# Patient Record
Sex: Male | Born: 1955 | Race: Black or African American | Hispanic: No | Marital: Married | Smoking: Never smoker
Health system: Southern US, Community
[De-identification: ages and names within clinical notes are randomized; demographics above are authoritative.]

## PROBLEM LIST (undated history)

## (undated) DIAGNOSIS — K219 Gastro-esophageal reflux disease without esophagitis: Secondary | ICD-10-CM

## (undated) DIAGNOSIS — D126 Benign neoplasm of colon, unspecified: Secondary | ICD-10-CM

## (undated) DIAGNOSIS — IMO0002 Reserved for concepts with insufficient information to code with codable children: Secondary | ICD-10-CM

## (undated) DIAGNOSIS — E785 Hyperlipidemia, unspecified: Secondary | ICD-10-CM

## (undated) DIAGNOSIS — I10 Essential (primary) hypertension: Secondary | ICD-10-CM

## (undated) DIAGNOSIS — C61 Malignant neoplasm of prostate: Secondary | ICD-10-CM

## (undated) HISTORY — DX: Gastro-esophageal reflux disease without esophagitis: K21.9

## (undated) HISTORY — DX: Hyperlipidemia, unspecified: E78.5

## (undated) HISTORY — DX: Essential (primary) hypertension: I10

## (undated) HISTORY — DX: Reserved for concepts with insufficient information to code with codable children: IMO0002

## (undated) HISTORY — PX: PROSTATECTOMY: SHX69

## (undated) HISTORY — DX: Malignant neoplasm of prostate: C61

## (undated) HISTORY — DX: Benign neoplasm of colon, unspecified: D12.6

## (undated) HISTORY — PX: KNEE ARTHROSCOPY W/ MENISCECTOMY: SHX1879

---

## 1999-04-25 ENCOUNTER — Encounter: Payer: Self-pay | Admitting: Family Medicine

## 1999-04-25 ENCOUNTER — Ambulatory Visit (HOSPITAL_COMMUNITY): Admission: RE | Admit: 1999-04-25 | Discharge: 1999-04-25 | Payer: Self-pay | Admitting: Family Medicine

## 2000-04-02 ENCOUNTER — Ambulatory Visit (HOSPITAL_COMMUNITY): Admission: RE | Admit: 2000-04-02 | Discharge: 2000-04-02 | Payer: Self-pay | Admitting: Family Medicine

## 2000-04-02 ENCOUNTER — Encounter: Payer: Self-pay | Admitting: Family Medicine

## 2000-04-21 HISTORY — PX: LAMINECTOMY AND MICRODISCECTOMY LUMBAR SPINE: SHX1913

## 2000-04-28 ENCOUNTER — Encounter: Payer: Self-pay | Admitting: Neurosurgery

## 2000-04-28 ENCOUNTER — Inpatient Hospital Stay (HOSPITAL_COMMUNITY): Admission: RE | Admit: 2000-04-28 | Discharge: 2000-04-29 | Payer: Self-pay | Admitting: Neurosurgery

## 2002-04-21 DIAGNOSIS — C61 Malignant neoplasm of prostate: Secondary | ICD-10-CM

## 2002-04-21 HISTORY — DX: Malignant neoplasm of prostate: C61

## 2003-02-08 ENCOUNTER — Other Ambulatory Visit: Admission: RE | Admit: 2003-02-08 | Discharge: 2003-02-08 | Payer: Self-pay | Admitting: Dermatology

## 2003-03-22 ENCOUNTER — Inpatient Hospital Stay (HOSPITAL_COMMUNITY): Admission: RE | Admit: 2003-03-22 | Discharge: 2003-03-27 | Payer: Self-pay | Admitting: Urology

## 2003-03-22 ENCOUNTER — Encounter (INDEPENDENT_AMBULATORY_CARE_PROVIDER_SITE_OTHER): Payer: Self-pay | Admitting: *Deleted

## 2004-06-12 ENCOUNTER — Ambulatory Visit (HOSPITAL_COMMUNITY): Admission: RE | Admit: 2004-06-12 | Discharge: 2004-06-12 | Payer: Self-pay | Admitting: Family Medicine

## 2004-06-20 ENCOUNTER — Ambulatory Visit: Payer: Self-pay | Admitting: Orthopedic Surgery

## 2004-06-20 ENCOUNTER — Ambulatory Visit (HOSPITAL_COMMUNITY): Admission: RE | Admit: 2004-06-20 | Discharge: 2004-06-20 | Payer: Self-pay | Admitting: Family Medicine

## 2004-07-18 ENCOUNTER — Ambulatory Visit: Payer: Self-pay | Admitting: Orthopedic Surgery

## 2004-07-19 ENCOUNTER — Ambulatory Visit: Payer: Self-pay | Admitting: Pulmonary Disease

## 2004-09-23 ENCOUNTER — Ambulatory Visit: Payer: Self-pay | Admitting: Pulmonary Disease

## 2004-10-21 ENCOUNTER — Ambulatory Visit: Payer: Self-pay | Admitting: Orthopedic Surgery

## 2005-04-21 HISTORY — PX: COLONOSCOPY: SHX174

## 2005-10-22 ENCOUNTER — Emergency Department (HOSPITAL_COMMUNITY): Admission: EM | Admit: 2005-10-22 | Discharge: 2005-10-22 | Payer: Self-pay | Admitting: Emergency Medicine

## 2005-12-19 ENCOUNTER — Ambulatory Visit (HOSPITAL_COMMUNITY): Admission: RE | Admit: 2005-12-19 | Discharge: 2005-12-19 | Payer: Self-pay | Admitting: Gastroenterology

## 2005-12-19 ENCOUNTER — Ambulatory Visit: Payer: Self-pay | Admitting: Gastroenterology

## 2005-12-19 ENCOUNTER — Encounter (INDEPENDENT_AMBULATORY_CARE_PROVIDER_SITE_OTHER): Payer: Self-pay | Admitting: *Deleted

## 2007-05-05 ENCOUNTER — Ambulatory Visit: Payer: Self-pay | Admitting: Orthopedic Surgery

## 2007-05-05 DIAGNOSIS — M719 Bursopathy, unspecified: Secondary | ICD-10-CM

## 2007-05-05 DIAGNOSIS — M25519 Pain in unspecified shoulder: Secondary | ICD-10-CM | POA: Insufficient documentation

## 2007-05-05 DIAGNOSIS — M67919 Unspecified disorder of synovium and tendon, unspecified shoulder: Secondary | ICD-10-CM | POA: Insufficient documentation

## 2007-07-06 ENCOUNTER — Emergency Department (HOSPITAL_COMMUNITY): Admission: EM | Admit: 2007-07-06 | Discharge: 2007-07-06 | Payer: Self-pay | Admitting: Emergency Medicine

## 2008-05-05 ENCOUNTER — Ambulatory Visit (HOSPITAL_COMMUNITY): Admission: RE | Admit: 2008-05-05 | Discharge: 2008-05-05 | Payer: Self-pay | Admitting: Family Medicine

## 2008-05-05 ENCOUNTER — Encounter: Payer: Self-pay | Admitting: Orthopedic Surgery

## 2008-06-26 ENCOUNTER — Ambulatory Visit: Payer: Self-pay | Admitting: Orthopedic Surgery

## 2009-12-10 ENCOUNTER — Ambulatory Visit: Payer: Self-pay | Admitting: Orthopedic Surgery

## 2009-12-10 DIAGNOSIS — M23329 Other meniscus derangements, posterior horn of medial meniscus, unspecified knee: Secondary | ICD-10-CM | POA: Insufficient documentation

## 2009-12-11 ENCOUNTER — Telehealth: Payer: Self-pay | Admitting: Orthopedic Surgery

## 2009-12-12 ENCOUNTER — Ambulatory Visit (HOSPITAL_COMMUNITY): Admission: RE | Admit: 2009-12-12 | Discharge: 2009-12-12 | Payer: Self-pay | Admitting: Orthopedic Surgery

## 2009-12-13 ENCOUNTER — Encounter: Payer: Self-pay | Admitting: Orthopedic Surgery

## 2009-12-17 ENCOUNTER — Ambulatory Visit: Payer: Self-pay | Admitting: Orthopedic Surgery

## 2009-12-17 DIAGNOSIS — M1710 Unilateral primary osteoarthritis, unspecified knee: Secondary | ICD-10-CM

## 2009-12-17 DIAGNOSIS — IMO0002 Reserved for concepts with insufficient information to code with codable children: Secondary | ICD-10-CM | POA: Insufficient documentation

## 2009-12-18 ENCOUNTER — Encounter: Payer: Self-pay | Admitting: Orthopedic Surgery

## 2009-12-18 ENCOUNTER — Encounter (INDEPENDENT_AMBULATORY_CARE_PROVIDER_SITE_OTHER): Payer: Self-pay | Admitting: *Deleted

## 2009-12-28 ENCOUNTER — Ambulatory Visit: Payer: Self-pay | Admitting: Orthopedic Surgery

## 2009-12-28 ENCOUNTER — Ambulatory Visit (HOSPITAL_COMMUNITY): Admission: RE | Admit: 2009-12-28 | Discharge: 2009-12-28 | Payer: Self-pay | Admitting: Orthopedic Surgery

## 2010-01-01 ENCOUNTER — Ambulatory Visit: Payer: Self-pay | Admitting: Orthopedic Surgery

## 2010-01-01 DIAGNOSIS — Z9889 Other specified postprocedural states: Secondary | ICD-10-CM | POA: Insufficient documentation

## 2010-01-02 ENCOUNTER — Encounter: Payer: Self-pay | Admitting: Orthopedic Surgery

## 2010-01-07 ENCOUNTER — Telehealth: Payer: Self-pay | Admitting: Orthopedic Surgery

## 2010-01-08 ENCOUNTER — Ambulatory Visit: Payer: Self-pay | Admitting: Orthopedic Surgery

## 2010-01-17 ENCOUNTER — Encounter: Payer: Self-pay | Admitting: Orthopedic Surgery

## 2010-01-23 ENCOUNTER — Ambulatory Visit: Payer: Self-pay | Admitting: Orthopedic Surgery

## 2010-01-24 ENCOUNTER — Encounter: Payer: Self-pay | Admitting: Orthopedic Surgery

## 2010-01-24 ENCOUNTER — Telehealth: Payer: Self-pay | Admitting: Orthopedic Surgery

## 2010-02-04 ENCOUNTER — Encounter (INDEPENDENT_AMBULATORY_CARE_PROVIDER_SITE_OTHER): Payer: Self-pay | Admitting: *Deleted

## 2010-02-26 ENCOUNTER — Ambulatory Visit: Payer: Self-pay | Admitting: Orthopedic Surgery

## 2010-04-02 ENCOUNTER — Ambulatory Visit: Payer: Self-pay | Admitting: Orthopedic Surgery

## 2010-04-02 DIAGNOSIS — IMO0002 Reserved for concepts with insufficient information to code with codable children: Secondary | ICD-10-CM | POA: Insufficient documentation

## 2010-04-25 ENCOUNTER — Ambulatory Visit
Admission: RE | Admit: 2010-04-25 | Discharge: 2010-04-25 | Payer: Self-pay | Source: Home / Self Care | Attending: Orthopedic Surgery | Admitting: Orthopedic Surgery

## 2010-05-23 NOTE — Letter (Signed)
Summary: Out of military duty   [facility]  [address]  [place], [place] [address]   Phone: [phone]  Fax: [phone]    December 10, 2009   Employee:  DAI MCADAMS    To Whom It May Concern:   For Medical reasons, please excuse the above named employee from [employer] duty   -- No military duty as follows:  Start:   8.22.11  End:   Until otherwise notified (additional test pending)   If you need additional information, please feel free to contact our office.         Sincerely,    Terrance Mass, MD

## 2010-05-23 NOTE — Letter (Signed)
Summary: Out of PE  [facility]  [address]  [place], [place] [address]   Phone: [phone]  Fax: [phone]    February 04, 2010   Student:  Zollie Scale    To Whom It May Concern:   For Medical reasons, please excuse the above named Patient from taking military PT test for the month of October.  Patient is cleared for February 19, 2010 to take test.  If you need additional information, please feel free to contact our office.  Sincerely,    Terrance Mass, MD   ****This is a legal document and cannot be tampered with.  Schools are authorized to verify all information and to do so accordingly.

## 2010-05-23 NOTE — Medication Information (Signed)
Summary: Medication list  Medication list   Imported By: Cammie Sickle 01/01/2010 09:45:16  _____________________________________________________________________  External Attachment:    Type:   Image     Comment:   External Document

## 2010-05-23 NOTE — Miscellaneous (Signed)
  phone call attempt to discuss results

## 2010-05-23 NOTE — Assessment & Plan Note (Signed)
Summary: 3 WK RE-CK POST OP KNEE/ MEDCOST/ AVW   Visit Type:  Follow-up Referring Provider:  self Primary Provider:  Dr. Lilyan Punt  CC:  right knee .  History of Present Illness: I saw [name] [name] in the office today for a 3 week  followup visit.  He is a right-handed 55 years old man with the complaint of:  righ tknee  DOS 12-28-09. Arthroscopy of right knee, partial medial menisectomy and resection medial plica.  Medications: Ibuprofen 800 mg.  Treated for bursitis pes anserine bursitis  Treatment:  Patient Instructions: 1)  ice x 20 min  2)  apply aspercreme two times a day  3)  return in 3 min   Complaints: He states his knee is better.  No further complaints over the medial side of the ankle present to have a bursitis there.  Has full range of motion without tenderness or swelling in the RIGHT knee  Discharged call if any problems  Allergies: No Known Drug Allergies   Impression & Recommendations:  Problem # 1:  PES ANSERINUS TENDINITIS OR BURSITIS (ICD-726.61) Assessment Improved  Orders: Est. Patient Level II (09811)  Problem # 2:  ARTHROSCOPY, KNEE, HX OF (ICD-V45.89) Assessment: Comment Only  Orders: Est. Patient Level II (91478)  Problem # 3:  DERANGEMENT OF POSTERIOR HORN OF MEDIAL MENISCUS (ICD-717.2) Assessment: Improved  Orders: Est. Patient Level II (29562)  Patient Instructions: 1)  Please schedule a follow-up appointment as needed.   Orders Added: 1)  Est. Patient Level II [13086]

## 2010-05-23 NOTE — Progress Notes (Signed)
Summary: has bumps & wheps on right leg  Phone Note Call from Patient   Summary of Call: [name] [name] ([birth year]) left a message that his right leg has bumps and wheps from ankle up to his thigh.  Has not taken anything different  that he is aware of.  He has an appointment with you Tuesday, 01/08/10.  He just wanted you to know what is going on. [phone] or [phone] Initial call taken by: Jacklynn Ganong,  January 07, 2010 9:03 AM  Follow-up for Phone Call        will see tomorrow  Follow-up by: Fuller Canada MD,  January 07, 2010 12:32 PM

## 2010-05-23 NOTE — Letter (Signed)
Summary: surgery order RT knee sched 12/28/09  surgery order RT knee sched 12/28/09   Imported By: Cammie Sickle 12/19/2009 19:38:40  _____________________________________________________________________  External Attachment:    Type:   Image     Comment:   External Document

## 2010-05-23 NOTE — Medication Information (Signed)
Summary: Tax adviser   Imported By: Cammie Sickle 12/13/2009 08:34:33  _____________________________________________________________________  External Attachment:    Type:   Image     Comment:   External Document

## 2010-05-23 NOTE — Letter (Signed)
Summary: [facility]  [address]  [place], [place] [address]   Phone: [phone]  Fax: [phone]      Today's Date: December 10, 2009  Name of Patient: [name] [name]  The above named patient had a medical visit today at:  2:30 pm.  Please take this into consideration when reviewing the time away from work/school.    Special Instructions:    [ X ] Due to medical reasons:           - To be off the remainder of today, 12/10/09, and returning 12/11/09 with the following restrictions:           - Office work only until further notice,            -    approximately the next 3 weeks, through 12/31/09   [ X ] Other __________           - Additional testing pending_________________   Sincerely yours,   Terrance Mass, MD

## 2010-05-23 NOTE — Assessment & Plan Note (Signed)
Summary: 2 WK RE-CK RT KNEE/POST OP/SURG 12/28/09/[facility]   Visit Type:  Follow-up Referring Provider:  self Primary Provider:  Dr. Lilyan Punt  CC:  post op.  History of Present Illness:  I saw [name] [name] in the office today   DOS 12-28-09. Arthroscopy of right knee, partial medial meniscectomy and resection medial plica.  The rash has resolved.  The patient was progressing very nicely until he stepped in a hole while he was at church and his knee started swelling had some increased pain  He seems to be walking with a limp today he does have a small joint effusion and some medial and lateral joint tenderness but he has maintained his full range of motion  Recommend ice in the evenings at the end of his shift and ibuprofen addendum milligrams 3 times a day for the next 3 days come back in one month  Allergies: No Known Drug Allergies   Impression & Recommendations:  Problem # 1:  ARTHROSCOPY, KNEE, HX OF (ICD-V45.89) Assessment Comment Only  Orders: Post-Op Check (91478)  Patient Instructions: 1)  Take Ibuprofen 800mg  three times a day for the next 3 days at least. 2)  Use ice at end of shift 3)  Come back in a month

## 2010-05-23 NOTE — Letter (Signed)
Summary: Out of Work  [facility]  [address]  [place], [place] [address]   Phone: [phone]  Fax: [phone]    January 01, 2010   Employee:  KAIYU MIRABAL    To Whom It May Concern:   For Medical reasons, please excuse the above named employee from work for the following dates:  Start:   12/28/09  End/Return to work full duty, no restrictions:    01/07/10   If you need additional information, please feel free to contact our office.         Sincerely,    Terrance Mass, MD

## 2010-05-23 NOTE — Progress Notes (Signed)
Summary: OK to do PT test?  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 Is this OK for him to do?  He will need a note either to OK the walk or not. His # [id number] or [id number] Initial call taken by: Jacklynn Ganong,  January 24, 2010 12:14 PM  Follow-up for Phone Call        yes Follow-up by: Fuller Canada MD,  January 24, 2010 1:35 PM

## 2010-05-23 NOTE — Letter (Signed)
Summary: FMLA form  FMLA form   Imported By: Cammie Sickle 01/08/2010 14:31:14  _____________________________________________________________________  External Attachment:    Type:   Image     Comment:   External Document

## 2010-05-23 NOTE — Assessment & Plan Note (Signed)
Summary: POST OP 1/RT KNEE SURG 12/28/09/[facility]   Visit Type:  POST OP Referring Provider:  self Primary Provider:  Dr. Lilyan Punt  CC:  RIGHT KNEE POST OP.  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 Arthroscopy of right knee, partial medial meniscectomy and resection medial plica.  Medications: Hydrocodone 7.5 mg/325 mg Tylenol 1-2 q4 as needed. He has not needed it very much.  His knee looks good his knee feels pretty good he like to try to do the physical therapy on his own.  He is very motivated has had arthroscopy before and I gave him some exercises to do for strengthening of his quads and range of motion.  He'll return in a week to see how he is doing with his home exercise program and determine whether or not he needs therapy  Allergies: No Known Drug Allergies   Other Orders: Post-Op Check ([address])  Patient Instructions: 1)  Home exercises  2)  Return 1 week 3)  May return to work Monday

## 2010-05-23 NOTE — Miscellaneous (Signed)
Summary: Pre-auth information for out-patient procedure  Clinical Lists Changes  Call to insurer [employer] (ph [phone]) re: out-patient surgery scheduled 12/28/09 at [facility], on 12/28/09 CPT 29880/29881, DX: 717.2 Per Clarita, intake coordinator, no pre-auth is required for out-patient surgery

## 2010-05-23 NOTE — Assessment & Plan Note (Signed)
Summary: RT KNEE PAIN,POSS INJEC PER S.LUKING/?NEED XRAY/[facility]ITY OF REIDS...   Vital Signs:  Patient profile:   55 year old male Height:      75 inches Weight:      248 pounds Pulse rate:   72 / minute Resp:     18 per minute  Vitals Entered By: Fuller Canada MD (December 10, 2009 2:20 PM)  Visit Type:  .newprob  Referring Toria Monte:  self Primary Lyana Asbill:  Dr. Lilyan Punt  CC:  right knee pain.  History of Present Illness: I saw [name] [name] in the office today for an initial visit.  He is a right-handed 55 years old man with the complaint of:  right knee pain.  Xrays today.  DOI 12/08/09.  Meds: Aspirin, Traim/HCTZ, Cozaar, Nifedipine, Metoprolol, Simvastatin, Nexium.    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  He shouldn't felt pain acutely was able to finish the move and then the knee started to swell, he lost ability to weight-bear in the knee as having a giving sensation.  He has fairly significant pain in his head stopped working and use a cane  He tried to wear her brace took some anti-inflammatories over-the-counter he did not improve.      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  police/military  Review of Systems Constitutional:  Denies weight loss, weight gain, fever, chills, and fatigue. Cardiovascular:  Denies chest pain, palpitations, fainting, and murmurs. Respiratory:  Denies short of breath, wheezing, couch, tightness, pain on inspiration, and snoring  . Gastrointestinal:  Denies heartburn, nausea, vomiting, diarrhea, constipation, and blood in your stools. Genitourinary:  Denies frequency, urgency, difficulty urinating, painful urination, flank pain, and bleeding in urine. Neurologic:  Denies numbness, tingling, unsteady gait, dizziness, tremors, and seizure. Musculoskeletal:  See HPI. Endocrine:  Denies excessive thirst, exessive urination, and heat or cold intolerance. Psychiatric:  Denies nervousness, depression, anxiety, and hallucinations. Skin:  Denies changes in the skin, poor healing, rash, itching, and redness. HEENT:  Denies blurred or double vision, eye pain, redness, and watering. Immunology:  Denies seasonal allergies, sinus problems, and allergic to bee stings. Hemoatologic:  Denies easy bleeding and brusing.  Physical Exam  Additional Exam:  well-built well-developed normal grooming and hygiene ambulates with a cane and a significant limp favoring the RIGHT leg  No distal swelling normal pulses in his RIGHT lower extremity  RIGHT groin no lymph nodes  Skin RIGHT knee normal  Sensation RIGHT leg normal  Psychiatric exam normal concentration, awake alert nor any x3 mood normal  gait pattern antalgic with a cane and a limp and minimal weightbearing on the RIGHT  RIGHT knee joint effusion medial joint line tenderness severe  Range of motion limited to 80  Strength normal  Knee stable  McMurray sign positive  Block to extension most likely meniscal fragment   Impression & Recommendations:  Problem # 1:  DERANGEMENT OF POSTERIOR HORN OF MEDIAL MENISCUS (ICD-717.2) Assessment New  aspiration RIGHT knee 20 cc all by injection of cortisone  Verbal consent was obtained. The knee was prepped with alcohol and  ethyl chloride. 1 cc of depomedrol 40mg /cc and 4 cc of lidocaine 1% was injected. there were no complications.   X-rays show 3 views RIGHT knee mild joint space narrowing very mild.  Orders: Est.  Patient Level IV ([address]) Knee x-ray,  3 views (60454) Joint Aspirate / Injection, Large (20610) Depo- Medrol 40mg  (J1030)  Patient Instructions: 1)  ice the knee frequently  2)  MRI right knee  3)  take the 2 medications  4)  return with the MRI  5)  OOW  6)  No Military  duty

## 2010-05-23 NOTE — Letter (Signed)
Summary: History form  History form   Imported By: Jacklynn Ganong 12/13/2009 11:57:13  _____________________________________________________________________  External Attachment:    Type:   Image     Comment:   External Document

## 2010-05-23 NOTE — Letter (Signed)
Summary: Out of School / Military Pt test  [facility]  [address]  [place], [place] [address]   Phone: [phone]  Fax: [phone]    January 24, 2010   Patient:  [name] [name]    To Whom It May Concern:   The above named patient is cleared for [employer] PT test of walking 2.5 miles.  If you need additional information,please feel free to contact our office    Sincerely,     Dr. Terrance Mass.         .          ****This is a legal document and cannot be tampered with.  Schools are authorized to verify all information and to do so accordingly.

## 2010-05-23 NOTE — Assessment & Plan Note (Signed)
Summary: MRI results from [facility]   Visit Type:  Follow-up Referring Provider:  self Primary Provider:  Dr. Lilyan Punt  CC:  mri results right knee.  History of Present Illness:  Meds: Aspirin, Traim/HCTZ, Cozaar, Nifedipine, Metoprolol, Simvastatin, Nexium, Motrin and Norco 5 giving relief.  DOI 12/08/09.  Meds: Aspirin, Traim/HCTZ, Cozaar, Nifedipine, Metoprolol, Simvastatin, Nexium.   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  He shouldn't felt pain acutely was able to finish the move and then the knee started to swell, he lost ability to weight-bear in the knee as having a giving sensation.  He has fairly significant pain in his head stopped working and use a cane  He tried to wear her brace took some anti-inflammatories over-the-counter he did not improve.  I aspirated and injected his knee and send him for an MRI which shows has a torn meniscus on the medial side with a possible lateral meniscal tear and 3 compartment arthritis  Although he is slightly improved from the removal of the fluid he still has significant pain, lack of motion including lack of extension with medial joint line symptoms.  After reviewing the MRI he will require surgery.      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  The review of systems is negative for Constitutional, Cardiovascular, Respiratory, Gastrointestinal, Genitourinary, Neurologic, Endocrine,  Psychiatric, Skin, HEENT, Immunology, and Hemoatologic.  Physical Exam  Additional Exam:  well-built well-developed normal grooming and hygiene ambulates with a cane and a significant limp favoring the RIGHT leg  No distal swelling normal pulses in his RIGHT lower extremity  RIGHT groin no lymph nodes  Skin RIGHT knee normal  Sensation RIGHT leg normal  Psychiatric exam normal concentration, awake alert nor any x3 mood normal  gait pattern antalgic with a cane and a limp and minimal weightbearing on the RIGHT  RIGHT knee joint effusion medial joint line tenderness severe  Range of motion improved to 100.  Strength normal  Knee stable  McMurray sign positive  Block to extension most likely meniscal fragment   Impression & Recommendations:  Problem # 1:  DERANGEMENT OF POSTERIOR HORN OF MEDIAL MENISCUS (ICD-717.2) Assessment Comment Only  Problem # 2:  KNEE, ARTHRITIS, DEGEN./OSTEO (ICD-715.96) Assessment: Comment Only  I recommended surgery for this patient with the understanding that he does have some arthritis in his knee which may give him some symptoms from time to time but it should not be any more that he was having before he hurt his knee this time.  He was offered nonoperative treatment as a potential option but understanding that this would most likely not get better on its own.  We plan to do an arthroscopy of the RIGHT knee with a partial medial meniscectomy.  We discussed not performing any major chondroplasties as this would remove cartilage from an arthritic knee and probably not make him any better and may make him worse  Patient Instructions: 1)    2)  recovery 3 weeks 3)   take the first week off  4)    5)    

## 2010-05-23 NOTE — Assessment & Plan Note (Signed)
Summary: POST OP 1/1 WK RE-CK RT KNEE/[facility]   Visit Type:  post op Referring Provider:  self Primary Provider:  Dr. Lilyan Punt  CC:  right knee.  History of Present Illness: I saw [name] [name] in the office today for a 1 week  followup visit.  He is a right-handed 55 years old man with the complaint of:  right knee  DOS 12-28-09. Arthroscopy of right knee, partial medial meniscectomy and resection medial plica.  Medications: none  The patient complains of a skin rash  He does have a skin rash from his ankle to the midportion of his thigh.  I think it's probably related to the cleaning solution used for surgery.  His wound portal sites look good.  He has excellent flexion of his knee.  There is a slight popping sensation and some patellofemoral crepitance on range of motion but otherwise doing well with a normal gait pattern  Recommend continue exercises he is allowed to go to the [facility] for a stationary bike.  He is given 2 medicines to apply to his knee.  He can also apply this to the skin on this leg.  Follow  2 weeks   Allergies: No Known Drug Allergies   Impression & Recommendations:  Problem # 1:  ARTHROSCOPY, KNEE, HX OF (ICD-V45.89)  Orders: Post-Op Check ([address])  Problem # 2:  KNEE, ARTHRITIS, DEGEN./OSTEO (ICD-715.96)  His updated medication list for this problem includes:    Tylenol Ex St Arthritis Pain 500 Mg Tabs (Acetaminophen)  Orders: Post-Op Check (60454)  Problem # 3:  DERANGEMENT OF POSTERIOR HORN OF MEDIAL MENISCUS (ICD-717.2)  Orders: Post-Op Check (09811)  Medications Added to Medication List This Visit: 1)  Benadryl Maximum Strength 2 % Crea (Diphenhydramine hcl) .... 4 x day to to right leg 2)  Ala-cort 1 % Lotn (Hydrocortisone) .... Apply two times a day  Patient Instructions: 1)  return in 2 weeks  Prescriptions: ALA-CORT 1 % LOTN (HYDROCORTISONE) apply two times a day  #1 x 1   Entered and Authorized by:   Fuller Canada MD  Signed by:   Fuller Canada MD on 01/08/2010   Method used:   Print then Give to Patient   RxID:   [id number] BENADRYL MAXIMUM STRENGTH 2 % CREA (DIPHENHYDRAMINE HCL) 4 x day to to right leg  #1 x 1   Entered and Authorized by:   Fuller Canada MD   Signed by:   Fuller Canada MD on 01/08/2010   Method used:   Print then Give to Patient   RxID:   [id number]

## 2010-05-23 NOTE — Assessment & Plan Note (Signed)
Summary: post op knee pain/no [email or web address].cbt   Visit Type:  Follow-up Referring Provider:  self Primary Provider:  Dr. Lilyan Punt  CC:  right knee pain.  History of Present Illness: I saw [name] [name] in the office today for a followup visit.  He is a right-handed 55 years old man with the complaint of:  right knee  DOS 12-28-09. Arthroscopy of right knee, partial medial menisectomy and resection medial plica.  Medications: Ibuprofen 800 mg.  Patient states he has been having pain for 2 weeks, no injury. No swelling.  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  He has tenderness directly over the tendon RIGHT at the joint line.  The medial femoral condyle is nontender.  Meniscal signs are negative his joint is stable  Injection was done in the past answering bursa  The patient is to continue with ice, ibuprofen and Aspercreme and return in 3 weeks.  I have also asked him to decrease the amount of time is pending on the elliptical trainer  Allergies: No Known Drug Allergies   Impression & Recommendations:  Problem # 1:  PES ANSERINUS TENDINITIS OR BURSITIS (ICD-726.61) Assessment New  Orders: Est. Patient Level III ([address]) Joint Aspirate / Injection, Large (20610) Depo- Medrol 40mg  (J1030)  Problem # 2:  ARTHROSCOPY, KNEE, HX OF (ICD-V45.89) Assessment: Unchanged  Orders: Est. Patient Level III (60454)  Problem # 3:  KNEE, ARTHRITIS, DEGEN./OSTEO (ICD-715.96) Assessment: Unchanged  His updated medication list for this problem includes:    Tylenol Ex St Arthritis Pain 500 Mg Tabs (Acetaminophen)  Orders: Est. Patient Level III (09811)  Problem # 4:  DERANGEMENT OF POSTERIOR HORN OF MEDIAL MENISCUS  (ICD-717.2) Assessment: Unchanged  Orders: Est. Patient Level III (91478)  Patient Instructions: 1)  ice x 20 min  2)  apply aspercreme two times a day  3)  return in 3 min    Orders Added: 1)  Est. Patient Level III [29562] 2)  Joint Aspirate / Injection, Large [20610] 3)  Depo- Medrol 40mg  [J1030]

## 2010-05-23 NOTE — Assessment & Plan Note (Signed)
Summary: 1 M RE-CK RT KNEE/POST OP 12/28/09/[facility]   Visit Type:  Follow-up Referring Provider:  self Primary Provider:  Dr. Lilyan Punt  CC:  right knee.  History of Present Illness: I saw [name] [name] in the office today for a followup visit.  He is a right-handed 55 years old man with the complaint of:  right knee  DOS 12-28-09. Arthroscopy of right knee, partial medial menisectomy and resection medial plica.  Medications: none.  NO COMPLAINTS   HIS KNEE LOOKS GOOD WITH NO SWELLING  AND FULL RANGE   Allergies: No Known Drug Allergies   Impression & Recommendations:  Problem # 1:  ARTHROSCOPY, KNEE, HX OF (ICD-V45.89) Assessment Improved  Orders: Post-Op Check (29562)  Problem # 2:  DERANGEMENT OF POSTERIOR HORN OF MEDIAL MENISCUS (ICD-717.2) Assessment: Improved  Orders: Post-Op Check (13086)  Patient Instructions: 1)  Return to normal activity slowly  2)  follow as needed    Orders Added: 1)  Post-Op Check [57846]

## 2010-05-23 NOTE — Letter (Signed)
Summary: FMLA form spouse  FMLA form spouse   Imported By: Cammie Sickle 01/22/2010 20:25:49  _____________________________________________________________________  External Attachment:    Type:   Image     Comment:   External Document

## 2010-05-23 NOTE — Progress Notes (Signed)
Summary: MRI appointment.  Phone Note Outgoing Call   Call placed by: Waldon Reining,  December 11, 2009 10:58 AM Call placed to: Patient Action Taken: Appt scheduled Summary of Call: I called to give the patient his MRI appointment at [facility] on 12-12-09 at 5:45. Patient has [employer], no precert is needed per [facility]. Patient will follow up here for results.

## 2010-05-23 NOTE — Letter (Signed)
Summary: Out of Work  [facility]  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   If you need additional information, please feel free to contact our office.         Sincerely,    Terrance Mass, MD

## 2010-07-04 LAB — CBC
HCT: 38.7 % — ABNORMAL LOW (ref 39.0–52.0)
Hemoglobin: 13 g/dL (ref 13.0–17.0)
MCH: 29.7 pg (ref 26.0–34.0)
MCHC: 33.5 g/dL (ref 30.0–36.0)
Platelets: 198 10*3/uL (ref 150–400)
RBC: 4.38 MIL/uL (ref 4.22–5.81)
RDW: 15.6 % — ABNORMAL HIGH (ref 11.5–15.5)

## 2010-07-04 LAB — DIFFERENTIAL
Eosinophils Relative: 2 % (ref 0–5)
Lymphocytes Relative: 42 % (ref 12–46)

## 2010-07-04 LAB — BASIC METABOLIC PANEL
Calcium: 8.7 mg/dL (ref 8.4–10.5)
Creatinine, Ser: 1.42 mg/dL (ref 0.4–1.5)
Glucose, Bld: 93 mg/dL (ref 70–99)
Potassium: 3.5 mEq/L (ref 3.5–5.1)
Sodium: 141 mEq/L (ref 135–145)

## 2010-07-04 LAB — SURGICAL PCR SCREEN
MRSA, PCR: NEGATIVE
Staphylococcus aureus: NEGATIVE

## 2010-09-06 NOTE — Discharge Summary (Signed)
NAME:  [name] [name], [name] [name]                        ACCOUNT NO.:  [id number]   MEDICAL RECORD NO.:  [id number]                   PATIENT TYPE:  INP   LOCATION:  0371                                 FACILITY:  [facility]   PHYSICIAN:  Rozanna Boer., M.D.      DATE OF BIRTH:  1955-04-26   DATE OF ADMISSION:  03/22/2003  DATE OF DISCHARGE:  03/27/2003                                 DISCHARGE SUMMARY   DISCHARGE DIAGNOSES:  1. 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  2. Hypertension.  3. Mild __________.   OPERATIONS/PROCEDURES:  Radical retropubic prostatectomy and bilateral  pelvic lymph-node dissection on March 22, 2003.   BRIEF HISTORY:  This 55 year old man is admitted with a clinical T1a right-  sided Gleason 3+3 adenocarcinoma of the prostate for radical retropubic  prostatectomy.  PSA was 6.6 with 5% free PSA where a year ago the PSA was 1.  Ultrasound was negative, no symptoms, but the biopsies were positive as  above.  He autodonated two units of blood and then came in for radical  surgery, understanding the risks including but not limited to incontinence,  impotence, deep vein thrombosis, pulmonary emboli, bleeding, and death.  He  had a mechanical bowel prep and autodonated two units of blood  preoperatively.   MEDICINES ON ADMISSION:  1. Simvastatin 40 mg, 1/2 tablet daily.  2. Metoprolol 100 mg b.i.d.  3. Nifedical 50 mg daily.  4. Triamterene/hydrochlorothiazide 1 per day.   ALLERGIES:  No allergies.   PREVIOUS OPERATIONS:  1. September 26, 1999, lumbar 4-5.  2. He had a left knee operation October 2000.   SOCIAL HISTORY:  Works at [employer].  Is also in the  [employer].   HOSPITAL COURSE:  After a satisfactory preoperative evaluation which  included a normal hematocrit of 40% and creatinine of 1.7, he was taken to  the operating room where he underwent radical retropubic prostatectomy.  His  pathology revealed a T2c Gleason 3+3  adenocarcinoma of the prostate with  bilateral disease but with small volume.  The margins were negative.  The  seminal vesicles were not involved.  Nodes were negative.  His hematocrit  dropped, and he did receive two units of autologous blood and one of packed  cells intraoperatively, but his creatinine remained stable.  His hematocrit  also remained stable.  On the third postoperative day it was 29, and on the  fourth postoperative day it was 27.6 hematocrit.  He was stable and eating a  regular diet.  He still had some JP drainage which was serous in nature.  Creatinine was the same as serum, which delayed his discharge.  By the fifth  postoperative day his JP drainage was less than 50 mL, and the drain was  removed.  He was sent home afebrile on  cephalexin 500 p.o. daily, OxyContin for pain, and his other home medicines  as above, with instructions to continue to walk, ambulate,  and come to the  office in two days for suture removal.  He was sent home in improved  ambulatory condition on a regular diet.                                               Rozanna Boer., M.D.    HMK/MEDQ  D:  03/27/2003  T:  03/27/2003  Job:  [id number]

## 2010-09-06 NOTE — Procedures (Signed)
NAMEBAIRD, POLINSKI              ACCOUNT NO.:  [id number]   MEDICAL RECORD NO.:  [id number]          PATIENT TYPE:  OUT   LOCATION:  RESP                          FACILITY:  [facility]   PHYSICIAN:  [name] L. Juanetta Gosling, M.D.DATE OF BIRTH:  [birth year]   DATE OF PROCEDURE:  DATE OF DISCHARGE:  06/20/2004                              PULMONARY FUNCTION TEST   RESULTS:  1.  Spirometry shows a mild ventilatory defect without definite air flow      obstruction except at the level of the smaller airways.  2.  Lung volumes show a minimal reduction in total lung capacity, which is      about the same degree as the ventilatory defect.  3.  DLCO is normal.      ELH/MEDQ  D:  06/24/2004  T:  06/24/2004  Job:  [id number]   cc:   Lorin Picket A. Gerda Diss, MD  [address], [address]  [place]  [place] [address]  Fax: [phone]

## 2010-09-06 NOTE — H&P (Signed)
NAME:  [name] [name], [name] [name]                        ACCOUNT NO.:  [id number]   MEDICAL RECORD NO.:  [id number]                   PATIENT TYPE:  INP   LOCATION:  NA                                   FACILITY:  [facility]   PHYSICIAN:  Rozanna Boer., M.D.      DATE OF BIRTH:  Aug 12, 1955   DATE OF ADMISSION:  03/22/2003  DATE OF DISCHARGE:                                HISTORY & PHYSICAL   BRIEF HISTORY:  This 55 year old black male is admitted with a T1A Gleason  3+3 right-sided adenocarcinoma of the prostate for radical retropubic  prostatectomy.  PSA was 6.65 with 5% free PSA where just a year ago it was  1.0.  Ultrasound was negative of his prostate and no extension of the  disease and really nothing on ultrasound to indicate macroscopic disease.  He has no symptoms.  His biopsies on February 23, 2003 showed less than 10%  of the biopsies on the right side with Gleason 3+3.  Right side biopsies  were negative with inflammation.  After discussing the risks, benefits of  different treatment options he chose to go ahead with radical retropubic  prostatectomy at this time with symptoms including, but not limited to,  incontinence, impotence, deep venous thrombosis, pulmonary emboli, bleeding,  and death.  He had mechanical bowel prep the day before surgery and auto  donated 2 units of blood preoperatively.   MEDICATIONS:  1. Simvastatin 40 mg one-half tablet daily.  2. Metoprolol 100 mg b.i.d.  3. Nifedical 60 mg daily.  4. Triamterene/hydrochlorothiazide 37 mg daily.   ALLERGIES:  No allergies.   PAST SURGICAL HISTORY:  1. Back operation for an L4-5 disk July 2001.  2. Left knee operation October 2000.   REVIEW OF SYSTEMS:  Nonsmoker.  No alcohol.  No cardiac or pulmonary  symptomatology.  No GI complaints.  No asthma.   SOCIAL HISTORY:  He is married.  39 year old daughter.  34 year old  daughter.  He works at [employer] and also in the [employer].   FAMILY HISTORY:  Negative family history for cancer of the prostate,  diabetes, and heart disease.  He has a 35 and 73 year old brother in good  health.   PHYSICAL EXAMINATION:  VITAL SIGNS:  Temperature 97.6, blood pressure  121/73, pulse 69.  GENERAL:  He is a healthy black male in no acute distress.  LUNGS:  Clear.  HEENT:  Clear.  Oropharynx is negative.  CHEST:  No murmurs, rubs, or gallops.  ABDOMEN:  Soft without masses or tenderness.  No CVA pain.  Liver, spleen,  and kidneys are not enlarged.  GENITOURINARY:  His testes are bilaterally descended.  Epididymes nontender.  No penile lesions.  Prostate about 32-35 g in size, not fixed or indurated.  No induration.  No seminal vesical enlargement.  EXTREMITIES:  No edema.  Good distal pulses.   IMPRESSION:  1. T1A Gleason 3+3 right-sided adenocarcinoma of the prostate.  2.  Hypertension.  3. Mild erectile dysfunction on medication.   RECOMMENDATIONS:  Radical retropubic prostatectomy as planned.                                               Rozanna Boer., M.D.    HMK/MEDQ  D:  03/21/2003  T:  03/21/2003  Job:  [id number]

## 2010-09-06 NOTE — Op Note (Signed)
NAMEDANNI, LEABO              ACCOUNT NO.:  [id number]   MEDICAL RECORD NO.:  [id number]          PATIENT TYPE:  AMB   LOCATION:  DAY                           FACILITY:  [facility]   PHYSICIAN:  Kassie Mends, M.D.      DATE OF BIRTH:  1956/02/17   DATE OF PROCEDURE:  12/19/2005  DATE OF DISCHARGE:                                 OPERATIVE REPORT   REFERRING PHYSICIAN:  Scott A. Luking, MD.   PROCEDURE:  Colonoscopy with cold forceps polypectomy.   PROCEDURE: Colonoscopy with cold forceps polypectomy   INDICATION FOR EXAM:  Mr. Drinkard is a 55 year old male who presents for  average risk colon cancer screening.   FINDINGS:  1. A 3 mm sigmoid polyp removed via cold forceps.  Otherwise no masses,      inflammatory changes or vascular ectasia seen.  2. Pan-colonic diverticulosis, most pronounced in the left colon.  3. Normal retroflexed view of the rectum.   RECOMMENDATIONS:  1. Follow up biopsies.  If polyp adenomatous, then would recommend      screening colonoscopy in 5 years.  All first degree relatives should      begin colon cancer screening at age 54 and every 5 years after that.  2. Follow with Dr. Lilyan Punt.  3. High fiber diet. Handout given on polyps and high fiber diet.   MEDICATIONS:  1. Demerol 100 mg IV.  2. Versed 6 mg IV.   PROCEDURE TECHNIQUE:  Physical exam was performed and informed consent was  obtained from the patient after explaining the benefits, alternatives and  risks of the procedure which the patient appeared to understand and so  stated.  The patient was connected to the monitoring and placed in the left  lateral position.  Continuous oxygen was provided by nasal cannula and IV  medicine administered via indwelling cannula.  After administration of  sedation  and rectal exam, the scope was advanced under direct visualization to the  cecum.  The scope was removed subsequently by carefully examining the  integrity, anatomy and vascular pattern of  the mucosa on the way out.  The  patient was recovered in endoscopy suite and discharged home in satisfactory  condition.      Kassie Mends, M.D.  Electronically Signed     SM/MEDQ  D:  12/19/2005  T:  12/19/2005  Job:  [id number]   cc:   Lorin Picket A. Gerda Diss, MD  Fax: [phone]

## 2010-09-06 NOTE — Consult Note (Signed)
[facility]  Patient:    [name] [name], [name] [name]                     MRN: [id number] Proc. Date: 04/28/00 Adm. Date:  [id number] Attending:  Josie Saunders                          Consultation Report  PREOPERATIVE DIAGNOSES:  Herniated disk, degenerative disk disease with spondylosis with radiculopathy L4-5 right.  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  PROCEDURE:  Hemisemilaminectomy with microdiskectomy L4-5 right with microdissection.  SURGEON:  Danae Orleans. Venetia Maxon, M.D.  ASSISTANT:  Payton Doughty, M.D.  ANESTHESIA:  General endotracheal.  ESTIMATED BLOOD LOSS:  Minimal.  COMPLICATIONS:  None.  DISPOSITION:  Recovery.  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 He has not improved with conservative management and is requiring significant doses of pain medicine both OxyIR and OxyContin without relief of his pain. He also has dorsiflexion weakness.  It was elected to take him to surgery for lumbar microdiskectomy.  DESCRIPTION OF PROCEDURE:  [name] [name] was brought to the operating room. Following the satisfactory and uncomplicated induction of general endotracheal anesthesia and placement of intravenous line, he was placed in the prone position on the Wilson frame.  His low back was prepped and draped in the usual sterile fashion.  The area of plain incision was infiltrated with 0.25% Marcaine, 0.50% lidocaine, and 1:200,000 epinephrine.  Incision was made in the midline overlying the L4-5 interspace and carried through approximately 1-1/2 inches of adipose tissue to the lumbodorsal fascia which was incised along the right side of the midline.  The L4-5 interspace was identified, cleared of infesting soft tissue and a self-retaining retractor was  placed. Intraoperative x-ray confirmed correct level.  Using the [facility] ______ drill with the ______ bur a hemisemilaminectomy at L4 was created.  This was then completed with Kerrison rongeurs.  Lateral recesses also decompressed.  The superior portion of the L5 lamina was removed.  Ligamentum of flavum was then removed in a piecemeal fashion decompressing the common dural tube and L5 nerve root.  Microscope was brought into the field and using microdissection technique the L5 nerve root was identified and cleared of fat and retracted medially exposing a subligamentous disk herniation directly beneath the L5 nerve root. The thin layer of ligament was incised with a 15 blade and several large pieces of herniated disk material were removed.  This tracked directly into the interspace and consequently it was elected to clear the interspace of residual disk tissue.  Using a variety of Epstein curets and surgical dynamics, downgoing curets, and a variety of pituitary rongeurs the interspace was cleared of residual disk material.  The lateral recess and foraminal region were also decompressed as was the more medial portion of the disk.  hemostasis was obtained with Gelfoam soaked in thrombin.  The L5 nerve root was felt to be well decompressed. The self-retaining retractors were removed. The microscope was taken out of the field.  Lumbodorsal fascia was closed with 0 Vicryl sutures. Subcutaneous tissue was reapproximated with 0 Vicryl and subsequently 2-0 Vicryl interrupted inverted stitches and the skin edges were reapproximated with interrupted 3-0 Vicryl subcuticular stitch.  The wound was dressed with benzoin, Steri-Strips, Telfa gauze, and  tape.  The patient was extubated in the operating room and taken to recovery in stable satisfactory condition having tolerated his operation well.  Counts were correct at the end of the case. DD:  04/28/00 TD:  04/28/00 Job: [id number]

## 2010-09-06 NOTE — Op Note (Signed)
NAME:  [name] [name], [name] [name]                        ACCOUNT NO.:  [id number]   MEDICAL RECORD NO.:  [id number]                   PATIENT TYPE:  INP   LOCATION:  0371                                 FACILITY:  [facility]   PHYSICIAN:  Courtney Paris, M.D.          DATE OF BIRTH:  13-Jun-1955   DATE OF PROCEDURE:  03/22/2003  DATE OF DISCHARGE:                                 OPERATIVE REPORT   PREOPERATIVE DIAGNOSIS:  Adenocarcinoma of the prostate.   POSTOPERATIVE DIAGNOSIS:  Adenocarcinoma of the prostate.   PROCEDURE:  Radical retropubic prostatectomy with bilateral lymph node  dissection.   SURGEON:  Courtney Paris, M.D.   ASSISTANT:  Susanne Borders, MD   ANESTHESIA:  General endotracheal.   SPECIMENS:  1. Prostate to pathology.  2. Bilateral pelvic lymph nodes to pathology.   INDICATIONS FOR PROCEDURE:  [name] [name] is a 55 year old African-American  male with a recent history of an elevated PSA. The patient was found to have  a PSA of 6.65 whereas his previous PSA has been around 1. He subsequently  underwent transrectal ultrasound guided prostate biopsy which revealed  Gleason's 3+3 equal 6 adenocarcinoma involving 10% of the specimens of the  right side. The patient was counseled as to his various treatment options  and elected to undergo radical retropubic prostatectomy after understanding  the risks, benefits, and alternatives.   DESCRIPTION OF PROCEDURE:  The patient was brought to the operating room and  correctly identified by his identification bracelet. He was given  preoperative antibiotics and general endotracheal anesthesia. He was placed  in a supine position with the table slightly flexed. He was shaved, prepped  and draped in typical sterile fashion. A 20 French Foley catheter was placed  in the bladder. A midline infraumbilical incision was made with the scalpel.  The Bovie electrocautery was used to dissect through Campers and Scarpa's  fascia down to  the rectus sheath. The rectus sheath was incised with Bovie  electrocautery in the midline revealing the two bellies of the rectus  abdominis muscles. The transversalis fascia between the two layers was  incised which allowed the space of Retzius to be entered. The space was  further defined using blunt dissection to peel away the loose areolar tissue  between the pelvic sidewall and the bladder. The Bookwalter retractor was  placed and the pelvic lymph node dissection was performed on both sides.  Metzenbaum scissors and right angle was used to carefully dissect the nodal  tissue from the iliac vein down to the level of the obturator nerve  posteriorly which was clearly defined and kept away from the dissection. The  dissection extended caudally to the obturator fossa where the obturator  nerve were taken. Care was taken to avoid damage to the obturator artery and  vein. Superiorly the lymph node packet was taken to the level of the ureter  as it crossed the iliac vessels. The lymph node packets  were clipped  distally and proximally with large Hem-o-Lok  clips to prevent lymphocele  formation. Other small vessels that were encountered were clipped with small  clips. Both lymph node packets were passed off the table and sent for  permanent section. Next, the Bookwalter retractor was used to retract the  bladder in a cephalad direction. A Kitner was used to sweep the areolar  tissue away from the endopelvic fascia. A small incision was made in the  endopelvic fascia with Metzenbaum scissors bilaterally. Bovie electrocautery  was used to carry this incision cephalad and laterally. The endopelvic  fascia was free distally to the point of attachment of the puboprostatic  ligament. A sponge stick was used to push the prostate posteriorly revealing  the puboprostatic ligaments. These were incised carefully with Metzenbaum  scissors releasing the prostate from the pubis. The groove between the   posterior aspect of the dorsal venous complex and the anterior urethra could  then be easily palpated with the surgeon's forefinger and thumb. A  Hoen______ right angle was placed within this groove beneath the dorsal  venous complex and the veins were ligated with a #1 Vicryl tie. A second tie  was placed and a third suture ligature was placed through the dorsal venous  complex. The Stamey retractor was placed and the prostate was retracted  cephalad and a back bleeding stitch using 2-0 Vicryl was placed in the  dorsal venous complex. The Hoen______ right angle was replaced beneath the  dorsal venous complex and the veins were incised with the Bovie  electrocautery. There was some bleeding noted from the dorsal venous complex  after this incision and this bleeding was controlled after placement of two  #2 Vicryl sutures in a figure-of-eight fashion through the dorsal venous  complex. There was quite a bit of brisk bleeding during this part of the  procedure. At this point, the bleeding was well controlled and the lateral  tissue that juxtaposed the urethral stump bilaterally was carefully  dissected away from the urethra with Metzenbaum scissors. Care was taken to  spare these tissues as they contained the neurovascular bundle. Once these  tissues were separated from the urethral stump, the right angle could be  passed easily beneath the urethra and a piece of umbilical tape was passed  posterior to the urethra. The anterior 3/4 of the urethral stump were  incised with the long handle scalpel. The Foley catheter was then well  lubricated and a valve stem was cut with heavy scissors and pulled into the  pelvis and clamped with a Kelly clamp. The posterior urethra was then  incised with Metzenbaum scissors. The apex of the prostate was then  completely freed and it could be retracted cephalad by pulling on the Foley catheter. The patient had prominent rectourethralis muscular attachments   which were incised with Metzenbaum scissors. The posterior plane between the  prostatic capsule and Denonvillier's fascia could then be established  bluntly with surgeon's finger. This revealed the lateral pedicles of the  prostate and  again care was taken to incise the lateral prostatic fossa  such that the neurovascular bundles could be spared bilaterally. The  pedicles were then subsequently taken down with passing a right angle  through the tissue and doubly ligating the pedicles with 1-0 silk suture  ligatures bilaterally. Once the pedicles were adequately taken down, the  ampulla of the vas and the seminal vesicles could be palpated posterior to  the prostate. The capsule tissue overlying these  structures was incised with  a Bovie electrocautery and the ampulla of the vas and bilateral seminal  vesicles were visualized. Attention was then turned to the bladder neck. Two  Allis clamps were placed above and below the plane of incision between the  base of the prostate and the floor of the bladder. A tonsil was used to  carefully dissect the detrusor fibers away from the base of the prostate.  After this dissection was performed, there was a small bladder neck defect  and the base of the prostate was freed from the bladder. The bladder mucosa  was visualized and the ureteral orifices were well away from the area of the  bladder neck defect. Attention was then turned again to the seminal vesicles  and ampulla of the vas. A right angle was used to carefully dissect the  ampulla of the vas and they were clipped with Hem-o-Lok  clips and incised.  The seminal vesicles were also dissected posteriorly and were clamped with a  right angle, tied with a 1-0 silk tie and incised with the Metzenbaum  scissors. The prostate was then completely freed and was passed off the  table. There were some small venous bleeding posterior to the bladder neck  defect and these were over sewn with a 2-0 chromic.  The bladder neck was  then reconstructed by placing a running 2-0 chromic at the 6 o'clock  position in a tennis racquet fashion. This part of the defect only had to be  closed approximately 2 cm. The bladder neck mucosa was then everted and the  bladder neck was incised and the surgeon's index finger could be placed  through the bladder neck quite easily. The bladder neck was then anastomosed  to the urethral stump by using the [facility] retractor. 2-0 Vicryl sutures  were placed at 2 o'clock, 5 o'clock, 7 o'clock, 10 o'clock and 12 o'clock.  These were placed on the outside-in fashion. The sutures were then placed at  their corresponding positions at the bladder neck after a Foley catheter was  placed through the urethra and into the bladder. The bladder was then  released from traction and was allowed to fall down into the pelvis. The  Vicryl sutures were then carefully tied down and the Foley catheter was irrigated. The anastomosis appeared to be water tight and irrigated nicely.  The entire wound was then copiously irrigated with antibiotic solution.  There was no active bleeding noted at this time. The rectus fascia was then  reapproximated with a #1 PDS in a running fashion. The skin was copiously  irrigated again with an antibiotic solution and the skin was closed with a  stapling device. Prior to closure, a #10 Blake drain was placed in the left  lower quadrant while making a small stab incision through the skin and  passing a tonsil through the anterior abdominal wall, grasping the Blake  drain and pulling it through the body wall. The drain was sewn in place with  a 3-0 nylon suture. The patient was then awakened from his anesthesia in  stable condition having tolerated the procedure well. He was taken to post  anesthesia care unit in stable condition. Please note that Dr. Aldean Ast was  present and participated in all aspects of this case as he is the primary  surgeon.      Susanne Borders, MD  Courtney Paris, M.D.    DR/MEDQ  D:  03/23/2003  T:  03/23/2003  Job:  [id number]

## 2010-09-06 NOTE — H&P (Signed)
[name]  Patient:    [name] [name], [name] [name]                     MRN: [id number] Adm. Date:  [id number] Attending:  Josie Saunders                         History and Physical  REASON FOR ADMISSION:  Herniated lumbar disc.  HISTORY OF PRESENT ILLNESS:  [name] [name] is an established patient of mine who I initially saw on April 29, 1995.  I saw him again in the office on April 17, 2000.  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  I evaluated him at that time for an intermittent radiculopathy with a herniated disc at the L4-5 level on the right and prescribed physical therapy and nonsurgical intervention at that point.  He did well with that disc herniation and said that he got better until January 2001 when he had a recurrent flare of back and right leg pain. He had a MRI of his lumbar spine performed at that point which demonstrated mild disc space narrowing at the L4-5 level and advanced lumbar facet arthropathy at L4-5 and L5-S1 without compression fracture or disc herniations on that study.  He was managed conservatively by Dr. Gerda Diss at that point with muscle relaxers and Relafen and got better after about two weeks.  He says he underwent knee arthroscopic surgery on the left on February 09, 2000 and says that the end of October he began to develop pain in his low back and that he was forced to lean to the left.  In early November he developed more constant and severe pain in his low back and into his right leg.  [name] [name] has been taking OxyIR and OxyContin and says these have not helped him.  He says that he is weak in his right leg.  He denies any weakness in his left lower extremity.  He denies any bowel or bladder dysfunction.  When asked to quantify the degree of pain, he says that in 1997 he had severity of pain of 5/10 with 0 being no pain and 10 being in agony.  He describes that  his current pain is 8 to 9/10 and is disabling him and preventing him from functioning.  PAST MEDICAL HISTORY:  Since his last visit, [name] [name] has not had any significant changes in his health status.  He is on medication for high blood pressure and also for cholesterol.  He had a left knee arthroscopy in October 2001.  ALLERGIES:  He has no known drug allergies.  CURRENT MEDICATIONS:  Toprol XL 100 mg twice daily. Triamterene/Hydrochlorothiazide once daily.  Nifedical 30 mg daily.  Zocor 20 mg daily.  OxyIR 5 mg one to two every four hours as needed for pain and Neurontin 100 mg up to three times daily but he has not been taking that.  REVIEW OF SYSTEMS:  Detailed review of system sheet was reviewed with the patient.  Pertinent positives include: Cardiovascular:  High blood pressure and high cholesterol.  Musculoskeletal:  Back pain.  All other systems negative.  SOCIAL HISTORY:  He is a nonsmoker and nondrinker.  No history of substance abuse.  He has lost 21 pounds and is 6 feet 3 inches and 245 pounds.  He is a recreational weight lifter.  DIAGNOSTIC STUDIES:  [name] [name]  presents with a new MRI of his lumbar spine which was obtained on April 02, 2000 which shows multilevel degenerative spondylitic changes with a new right sided disc herniation at the L4-5 level with a free fragment which causes mass effect on the right L5 nerve root.  PHYSICAL EXAMINATION:  GENERAL APPEARANCE: [name] [name] is a fit appearing muscular black male who appears to be extremely uncomfortable.  He has difficulty rising from a seated position secondary to pain in his right leg.  HEENT: Normocephalic and atraumatic.  The pupils are equal, round and reactive to light.  Extraocular muscles are intact.  Sclerae are white.  Conjunctivae is pink.  Oral pharynx is benign.  Uvula in midline.  NECK: There are no masses, meningismus, deformities, tracheal deviation, jugular vein distention or carotid  bruits.  There is normal range of cervical motion. Spurlings test is negative without reproducible radicular pain turning the patients head to either side.  Lhermittes sign is not present with axial compression.  RESPIRATORY:  There is normal respiratory effort with good intercostal function.  The lungs are clear to auscultation.  There are no rales, rhonchi or wheezes.  CARDIOVASCULAR:  The heart has regular rate and rhythm to auscultation.  No murmurs are appreciated.  There is no extremity edema, clubbing or cyanosis. There are palpable pedal pulses.  ABDOMEN:  Soft and nontender.  No hepatosplenomegaly appreciated or masses. There are active bowel sounds.  No guarding or rebound.  MUSCULOSKELETAL:  He walks with an antalgic gait favoring his right lower extremity.  He is able to walk on his toes but has difficulty walking on his heel on the right.  He is able to do son on the left.  Straight leg raising positive at 30 degrees on the right.  Cross straight leg raising positive on the left for right leg pain.  NEUROLOGIC:  The patient is oriented to time, person and place and has good recall of both recent and remote memory with normal attention span and concentration.  The patient speaks with clear and fluent speech and exhibits normal and good function and appropriate fund of knowledge.  Cranial nerves examination:  Pupils are equal, round and reactive to light.  Extraocular movements are full.  Visual fields are full to confrontational testing. Facial sensation and facial motor are intact and symmetric.  Hearing is intact to finger rub.  Palate is upgoing.  Shoulder shrug is symmetric.  Tongue protrudes in the midline.  Motor examination:  Motor strength is 5/5 in bilateral deltoids, biceps, triceps, hand grips, wrist extensors, interossei. In the lower extremities motor strength is 5/5 in hip flexion, extension, quadriceps, hamstrings, plantar flexion, dorsiflexion and left  extensor hallucis longus with the exception of gluteus strength on the right at 4/5 and right extensor hallucis longus strength at 4+/5.  Sensory examination normal  to pin in his lower extremities and in his upper extremities.  Deep tendon reflexes are 2 in the biceps, triceps and brachioradialis, 2 at the knees, 2 at the ankles.  Great toes are downgoing to plantar stimulation.  Cerebellar examination:  Normal coordination in the upper and lower extremities.  Normal rapid alternating movement.  Romberg test is negative.  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  There appears to be significant change in worsening of this disc herniation with a new free fragment herniation at the  L4-5 level on the right which is directly compressing the L5 nerve root. He has significant radiculopathy on examination.  I have recommended to him based on the severity of pain and weakness, failure to improve on strong narcotic analgesics that he undergo microdiskectomy.  This would consist of L4-5 macrodiskectomy on the right for herniated lumbar disc.  I reviewed the studies with the patient, went over his physical examination. I reviewed surgical models and discussed typical hospital course and operative and postoperative course and the potential risks and benefits of surgery.  The risks of surgery were discussed in detail and include but are not limited to the risks of anesthesia, blood loss, the possibility of hemorrhage, infection, damage to nerves, damage to blood vessels, injury to lumbar nerve root causing either temporary or permanent leg pain, numbness and/or weakness.  There is a potential for spinal fluid leak from dural tear.  There is potential for post laminectomy spondylolithesis, recurrent disc herniation quoted at approximately 10%, failure to  relieve pain, worsening of pain, need for further surgery.  The surgery is set up for April 28, 2000. DD:  04/28/00 TD:  04/28/00 Job: [id number]

## 2010-10-31 ENCOUNTER — Ambulatory Visit (INDEPENDENT_AMBULATORY_CARE_PROVIDER_SITE_OTHER): Payer: PRIVATE HEALTH INSURANCE | Admitting: Gastroenterology

## 2010-10-31 ENCOUNTER — Encounter: Payer: Self-pay | Admitting: General Practice

## 2010-10-31 ENCOUNTER — Encounter: Payer: Self-pay | Admitting: Gastroenterology

## 2010-10-31 VITALS — BP 134/82 | HR 64 | Temp 97.4°F | Ht 75.0 in | Wt 267.2 lb

## 2010-10-31 DIAGNOSIS — D126 Benign neoplasm of colon, unspecified: Secondary | ICD-10-CM

## 2010-10-31 DIAGNOSIS — K635 Polyp of colon: Secondary | ICD-10-CM | POA: Insufficient documentation

## 2010-10-31 MED ORDER — PEG-KCL-NACL-NASULF-NA ASC-C 100 G PO SOLR: 1.0000 | Freq: Once | ORAL | Status: DC

## 2010-10-31 NOTE — Progress Notes (Signed)
Addended by: Jennings Books on: 10/31/2010 09:42 AM   Modules accepted: Orders

## 2010-10-31 NOTE — Assessment & Plan Note (Signed)
Last TCS 2007.  TCS 2012 and if simple adenoma or no polyps then will extend interval to 10 years. OPV prn.

## 2010-10-31 NOTE — Progress Notes (Signed)
Cc to PCP 

## 2010-10-31 NOTE — Progress Notes (Signed)
  Subjective:    Patient ID: [name] [name], male    DOB: 05-Sep-1955, 55 y.o.   MRN: [id number]  PCP: Charlies Silvers  HPI No BRBPR, diarrhea, change in bowel habits, problems swallowing, or heartburn/indigestion. Uses OMP prn.   Past Medical History  Diagnosis Date  . HTN (hypertension)   . Hyperlipemia   . Adenomatous colon polyp 2007 [place]  . GERD (gastroesophageal reflux disease)   . Prostate ca 2004  . DDD (degenerative disc disease)     Past Surgical History  Procedure Date  . Laminectomy and microdiscectomy lumbar spine 2002  . Knee arthroscopy w/ meniscectomy 2011 RIGHT    No Known Allergies  Current Outpatient Prescriptions  Medication Sig Dispense Refill  . aspirin 325 MG tablet Take 325 mg by mouth daily.        Marland Kitchen atorvastatin (LIPITOR) 40 MG tablet Take 40 mg by mouth daily.        . hydrochlorothiazide 25 MG tablet Take 25 mg by mouth daily.        Marland Kitchen losartan (COZAAR) 100 MG tablet Take 100 mg by mouth daily.        . metoprolol (TOPROL-XL) 50 MG 24 hr tablet Take 50 mg by mouth daily.        . Multiple Vitamin (MULTIVITAMIN) capsule Take 1 capsule by mouth daily.        Marland Kitchen NIFEdipine (PROCARDIA XL/ADALAT-CC) 90 MG 24 hr tablet Take 90 mg by mouth daily.        Marland Kitchen omeprazole (PRILOSEC) 20 MG capsule Take 20 mg by mouth daily.        . tadalafil (CIALIS) 5 MG tablet Take 5 mg by mouth daily as needed.         Family History  Problem Relation Age of Onset  . Colon cancer Neg Hx   . Colon polyps Neg Hx      Review of Systems     Objective:   Physical Exam  Constitutional: He is oriented to person, place, and time. He appears well-developed and well-nourished. No distress.  HENT:  Head: Normocephalic and atraumatic.  Cardiovascular: Normal rate, regular rhythm and normal heart sounds.   Pulmonary/Chest: Effort normal and breath sounds normal.  Abdominal: Soft. Bowel sounds are normal. He exhibits no distension. There is no tenderness.  Musculoskeletal: He  exhibits no edema.  Neurological: He is alert and oriented to person, place, and time.          Assessment & Plan:

## 2010-11-05 NOTE — Progress Notes (Signed)
TCS WITH DR FIELDS ON 07/27

## 2010-11-14 MED ORDER — SODIUM CHLORIDE 0.45 % IV SOLN
Freq: Once | INTRAVENOUS | Status: AC
  Administered 2010-11-15: 1000 mL via INTRAVENOUS

## 2010-11-15 ENCOUNTER — Encounter (HOSPITAL_COMMUNITY)
Admission: RE | Disposition: A | Discharge status: Home / Self Care | Payer: Self-pay | Source: Ambulatory Visit | Attending: Gastroenterology

## 2010-11-15 ENCOUNTER — Other Ambulatory Visit: Payer: Self-pay | Admitting: Gastroenterology

## 2010-11-15 ENCOUNTER — Encounter (HOSPITAL_COMMUNITY): Payer: Self-pay | Admitting: *Deleted

## 2010-11-15 ENCOUNTER — Ambulatory Visit (HOSPITAL_COMMUNITY)
Admission: RE | Admit: 2010-11-15 | Discharge: 2010-11-15 | Disposition: A | Discharge status: Home / Self Care | Payer: PRIVATE HEALTH INSURANCE | Source: Ambulatory Visit | Attending: Gastroenterology | Admitting: Gastroenterology

## 2010-11-15 ENCOUNTER — Encounter: Payer: PRIVATE HEALTH INSURANCE | Admitting: Gastroenterology

## 2010-11-15 DIAGNOSIS — Z79899 Other long term (current) drug therapy: Secondary | ICD-10-CM | POA: Insufficient documentation

## 2010-11-15 DIAGNOSIS — K648 Other hemorrhoids: Secondary | ICD-10-CM | POA: Insufficient documentation

## 2010-11-15 DIAGNOSIS — D126 Benign neoplasm of colon, unspecified: Secondary | ICD-10-CM | POA: Insufficient documentation

## 2010-11-15 DIAGNOSIS — K5730 Diverticulosis of large intestine without perforation or abscess without bleeding: Secondary | ICD-10-CM

## 2010-11-15 DIAGNOSIS — Z860100 Personal history of colon polyps, unspecified: Secondary | ICD-10-CM | POA: Insufficient documentation

## 2010-11-15 DIAGNOSIS — E785 Hyperlipidemia, unspecified: Secondary | ICD-10-CM | POA: Insufficient documentation

## 2010-11-15 DIAGNOSIS — I10 Essential (primary) hypertension: Secondary | ICD-10-CM | POA: Insufficient documentation

## 2010-11-15 HISTORY — PX: COLONOSCOPY: SHX5424

## 2010-11-15 SURGERY — COLONOSCOPY
Anesthesia: Moderate Sedation

## 2010-11-15 MED ORDER — MEPERIDINE HCL 100 MG/ML IJ SOLN
INTRAMUSCULAR | Status: AC
  Filled 2010-11-15: qty 2

## 2010-11-15 MED ORDER — MEPERIDINE HCL 100 MG/ML IJ SOLN
INTRAMUSCULAR | Status: DC | PRN
  Administered 2010-11-15: 25 mg via INTRAVENOUS
  Administered 2010-11-15: 50 mg via INTRAVENOUS
  Administered 2010-11-15: 25 mg via INTRAVENOUS

## 2010-11-15 MED ORDER — MIDAZOLAM HCL 5 MG/5ML IJ SOLN
INTRAMUSCULAR | Status: AC
  Filled 2010-11-15: qty 10

## 2010-11-15 MED ORDER — STERILE WATER FOR IRRIGATION IR SOLN
Status: DC | PRN
  Administered 2010-11-15: 12:00:00

## 2010-11-15 MED ORDER — MIDAZOLAM HCL 5 MG/5ML IJ SOLN
INTRAMUSCULAR | Status: DC | PRN
  Administered 2010-11-15 (×3): 2 mg via INTRAVENOUS
  Administered 2010-11-15: 1 mg via INTRAVENOUS

## 2010-11-15 NOTE — H&P (Signed)
Reason for Visit     Colonoscopy        Current Vitals       Recorded User        10/31/2010  9:06 AM  Ginger L Walker, NT           BP Pulse Temp (Src) Resp Ht Wt    134/82  64  97.4 F (36.3 C) (Temporal)  N/A  6\' 3"  (1.905 m)  267 lb 3.2 oz (121.201 kg)       BMI SpO2 PF    33.40 kg/m2  N/A  N/A          Progress Notes     Jonette Eva, MD  10/31/2010  9:27 AM  Signed    Subjective:      Patient ID: [name] [name], male    DOB: Dec 23, 1955, 55 y.o.   MRN: [id number]   PCP: Charlies Silvers   HPI No BRBPR, diarrhea, change in bowel habits, problems swallowing, or heartburn/indigestion. Uses OMP prn.     Past Medical History   Diagnosis  Date   .  HTN (hypertension)     .  Hyperlipemia     .  Adenomatous colon polyp  2007 [place]   .  GERD (gastroesophageal reflux disease)     .  Prostate ca  2004   .  DDD (degenerative disc disease)         Past Surgical History   Procedure  Date   .  Laminectomy and microdiscectomy lumbar spine  2002   .  Knee arthroscopy w/ meniscectomy  2011 RIGHT      No Known Allergies    Current Outpatient Prescriptions   Medication  Sig  Dispense  Refill   .  aspirin 325 MG tablet  Take 325 mg by mouth daily.           Marland Kitchen  atorvastatin (LIPITOR) 40 MG tablet  Take 40 mg by mouth daily.           .  hydrochlorothiazide 25 MG tablet  Take 25 mg by mouth daily.           Marland Kitchen  losartan (COZAAR) 100 MG tablet  Take 100 mg by mouth daily.           .  metoprolol (TOPROL-XL) 50 MG 24 hr tablet  Take 50 mg by mouth daily.           .  Multiple Vitamin (MULTIVITAMIN) capsule  Take 1 capsule by mouth daily.           Marland Kitchen  NIFEdipine (PROCARDIA XL/ADALAT-CC) 90 MG 24 hr tablet  Take 90 mg by mouth daily.           Marland Kitchen  omeprazole (PRILOSEC) 20 MG capsule  Take 20 mg by mouth daily.           .  tadalafil (CIALIS) 5 MG tablet  Take 5 mg by mouth daily as needed.            Family History   Problem  Relation  Age of Onset   .  Colon cancer  Neg Hx      .  Colon polyps  Neg Hx          Review of Systems     Objective:    Physical Exam  Constitutional: He is oriented to person, place, and time. He appears well-developed and well-nourished. No distress.  HENT:  Head: Normocephalic and atraumatic.  Cardiovascular: Normal rate, regular rhythm and normal heart sounds.   Pulmonary/Chest: Effort normal and breath sounds normal.  Abdominal: Soft. Bowel sounds are normal. He exhibits no distension. There is no tenderness.  Musculoskeletal: He exhibits no edema.  Neurological: He is alert and oriented to person, place, and time.           Assessment & Plan:    Adenomatous colon polyp - Jonette Eva, MD  10/31/2010  9:27 AM  Signed Last TCS 2007.   TCS 2012 and if simple adenoma or no polyps then will extend interval to 10 years. OPV prn.

## 2010-11-15 NOTE — Interval H&P Note (Signed)
History and Physical Interval Note:   11/15/2010   12:05 PM   [name] [name]  has presented today for surgery, with the diagnosis of h/o polyps  The various methods of treatment have been discussed with the patient and family. After consideration of risks, benefits and other options for treatment, the patient has consented to  Procedure(s): COLONOSCOPY as a surgical intervention .  I have reviewed the patients' chart and labs.  Questions were answered to the patient's satisfaction.     Jonette Eva  MD

## 2010-11-22 ENCOUNTER — Encounter (HOSPITAL_COMMUNITY): Payer: Self-pay | Admitting: Gastroenterology

## 2010-11-25 ENCOUNTER — Telehealth: Payer: Self-pay | Admitting: Gastroenterology

## 2010-11-25 NOTE — Telephone Encounter (Signed)
Please call pt. He had a simple adenoma removed. TCS in 3 years. HIGH FIBER DIET. ALL FIRST DEGREE RELATIVES NEED A TCS AT AGE 56.

## 2010-11-26 NOTE — Telephone Encounter (Signed)
Pt aware, please nic 

## 2010-12-25 NOTE — Telephone Encounter (Signed)
Reminder in epic to have tcs done in 3 years

## 2011-01-01 ENCOUNTER — Ambulatory Visit: Payer: Self-pay | Admitting: Gastroenterology

## 2011-02-26 ENCOUNTER — Encounter: Payer: Self-pay | Admitting: Orthopedic Surgery

## 2011-02-26 ENCOUNTER — Ambulatory Visit (INDEPENDENT_AMBULATORY_CARE_PROVIDER_SITE_OTHER): Payer: PRIVATE HEALTH INSURANCE | Admitting: Orthopedic Surgery

## 2011-02-26 VITALS — Ht 75.0 in | Wt 247.0 lb

## 2011-02-26 DIAGNOSIS — M719 Bursopathy, unspecified: Secondary | ICD-10-CM

## 2011-02-26 DIAGNOSIS — M7550 Bursitis of unspecified shoulder: Secondary | ICD-10-CM | POA: Insufficient documentation

## 2011-02-26 MED ORDER — METHYLPREDNISOLONE ACETATE PF 40 MG/ML IJ SUSP: 40.0000 mg | Freq: Once | INTRAMUSCULAR | Status: DC

## 2011-02-26 NOTE — Patient Instructions (Signed)
You have received a steroid shot. 15% of patients experience increased pain at the injection site with in the next 24 hours. This is best treated with ice and tylenol extra strength 2 tabs every 8 hours. If you are still having pain please call the office.    

## 2011-02-26 NOTE — Progress Notes (Signed)
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  He only has pain at night.  It is severe when it occurs.  It has not been associated with trauma.  He is an avid weightlifter lifts heavy weights and has not had any difficulties while doing that.  The RIGHT shoulder is making a popping and cracking noise.  The LEFT is making a clicking noise.  He has not lost any motion or strength.  He denies neck pain.  He denies any numbness or tingling.  Past Medical History  Diagnosis Date  . HTN (hypertension)   . Hyperlipemia   . Adenomatous colon polyp 2007 [place]  . GERD (gastroesophageal reflux disease)   . Prostate ca 2004  . DDD (degenerative disc disease)    Physical Exam(12) GENERAL: normal development   CDV: pulses are normal   Skin: normal  Lymph: nodes were not palpable/normal  Psychiatric: awake, alert and oriented  Neuro: normal sensation  MSK His ambulation is normal 1 The RIGHT shoulder is making a loud popping noise on flexion and extension but has full range of motion.  There were no strength deficits in the rotator cuff.  The shoulder is stable.  It is nontender.  Provocative tests for impingement are negative. 2 LEFT shoulder is making a clicking noise on flexion-extension but has full range of motion no strength deficits.  He is stable.  He is nontender.  It's provocative tests are negative for impingement. 3 C-Spine nontender with normal range of motion.   Assessment: Bilateral shoulder bursitis      Plan: Bilateral shoulder injections  Shoulder Injection Procedure Note   Pre-operative Diagnosis: right  RC Syndrome  Post-operative Diagnosis: same  Indications: pain    Anesthesia: ethyl chloride   Procedure Details   Verbal consent was obtained for the procedure. The shoulder was prepped withalcohol and the skin was anesthetized. A 20 gauge needle was advanced into the subacromial space through posterior approach without difficulty  The space was then injected with 3 ml 1% lidocaine and 1 ml of depomedrol. The injection site was cleansed with isopropyl alcohol and a dressing was applied.  Complications:  None; patient tolerated the procedure well.   Subacromial Shoulder Injection Procedure Note  Pre-operative Diagnosis: left RC Syndrome  Post-operative Diagnosis: same  Indications: pain   Anesthesia: ethyl chloride   Procedure Details   Verbal consent was obtained for the procedure. The shoulder was prepped withalcohol and the skin was anesthetized. A 20 gauge needle was advanced into the subacromial space through posterior approach without difficulty  The space was then injected with 3 ml 1% lidocaine and 1 ml of depomedrol. The injection site was cleansed with isopropyl alcohol and a dressing was applied.  Complications:  None; patient tolerated the procedure well.

## 2011-05-09 ENCOUNTER — Ambulatory Visit (INDEPENDENT_AMBULATORY_CARE_PROVIDER_SITE_OTHER): Payer: PRIVATE HEALTH INSURANCE | Admitting: Urology

## 2011-05-09 DIAGNOSIS — N529 Male erectile dysfunction, unspecified: Secondary | ICD-10-CM

## 2011-05-09 DIAGNOSIS — Z8546 Personal history of malignant neoplasm of prostate: Secondary | ICD-10-CM

## 2011-05-09 DIAGNOSIS — N393 Stress incontinence (female) (male): Secondary | ICD-10-CM

## 2011-12-20 ENCOUNTER — Other Ambulatory Visit: Payer: Self-pay | Admitting: Orthopedic Surgery

## 2011-12-20 DIAGNOSIS — M5430 Sciatica, unspecified side: Secondary | ICD-10-CM

## 2011-12-20 MED ORDER — GABAPENTIN 100 MG PO CAPS: 100.0000 mg | ORAL_CAPSULE | Freq: Every evening | ORAL | Status: DC | PRN

## 2011-12-20 MED ORDER — PREDNISONE 10 MG PO KIT: 10.0000 mg | PACK | ORAL | Status: DC

## 2012-08-19 ENCOUNTER — Other Ambulatory Visit: Payer: Self-pay | Admitting: *Deleted

## 2012-08-19 MED ORDER — LOSARTAN POTASSIUM-HCTZ 100-25 MG PO TABS: 1.0000 | ORAL_TABLET | Freq: Every day | ORAL | Status: DC

## 2012-09-07 ENCOUNTER — Ambulatory Visit (INDEPENDENT_AMBULATORY_CARE_PROVIDER_SITE_OTHER): Payer: PRIVATE HEALTH INSURANCE | Admitting: Urology

## 2012-09-07 DIAGNOSIS — N529 Male erectile dysfunction, unspecified: Secondary | ICD-10-CM

## 2012-09-07 DIAGNOSIS — N393 Stress incontinence (female) (male): Secondary | ICD-10-CM

## 2012-09-07 DIAGNOSIS — Z8546 Personal history of malignant neoplasm of prostate: Secondary | ICD-10-CM

## 2012-09-07 DIAGNOSIS — F5232 Male orgasmic disorder: Secondary | ICD-10-CM

## 2012-10-01 ENCOUNTER — Encounter: Payer: Self-pay | Admitting: *Deleted

## 2012-10-06 ENCOUNTER — Encounter: Payer: Self-pay | Admitting: Family Medicine

## 2012-10-06 ENCOUNTER — Ambulatory Visit (INDEPENDENT_AMBULATORY_CARE_PROVIDER_SITE_OTHER): Payer: PRIVATE HEALTH INSURANCE | Admitting: Family Medicine

## 2012-10-06 VITALS — BP 134/86 | Wt 257.0 lb

## 2012-10-06 DIAGNOSIS — E785 Hyperlipidemia, unspecified: Secondary | ICD-10-CM

## 2012-10-06 DIAGNOSIS — G609 Hereditary and idiopathic neuropathy, unspecified: Secondary | ICD-10-CM

## 2012-10-06 DIAGNOSIS — I10 Essential (primary) hypertension: Secondary | ICD-10-CM

## 2012-10-06 NOTE — Progress Notes (Signed)
  Subjective:    Patient ID: [name] [name], male    DOB: 05/18/1955, 57 y.o.   MRN: [id number]  Hypertension This is a chronic problem. The current episode started more than 1 year ago.      Review of Systems     Objective:   Physical Exam        Assessment & Plan:

## 2012-10-06 NOTE — Progress Notes (Signed)
  Subjective:    Patient here for follow-up of elevated blood pressure.  He is exercising and is adherent to a low-salt diet.  Blood pressure is well controlled at home. Cardiac symptoms: none. Patient denies: all. Cardiovascular risk factors: advanced age (older than 60 for men, 81 for women), dyslipidemia, hypertension and male gender. Use of agents associated with hypertension: none. History of target organ damage: none.  The following portions of the patient's history were reviewed and updated as appropriate: allergies, current medications, past family history, past medical history, past social history, past surgical history and problem list.  Review of Systems Constitutional: negative     Objective:    BP 134/86  Wt 257 lb (116.574 kg)  BMI 32.12 kg/m2  General Appearance:    Alert, cooperative, no distress, appears stated age  Head:    Normocephalic, without obvious abnormality, atraumatic  Eyes:    PERRL, conjunctiva/corneas clear, EOM's intact, fundi    benign, both eyes       Ears:    Normal TM's and external ear canals, both ears  Nose:   Nares normal, septum midline, mucosa normal, no drainage    or sinus tenderness  Throat:   Lips, mucosa, and tongue normal; teeth and gums normal  Neck:   Supple, symmetrical, trachea midline, no adenopathy;       thyroid:  No enlargement/tenderness/nodules; no carotid   bruit or JVD     Lungs:     Clear to auscultation bilaterally, respirations unlabored  Chest wall:    No tenderness or deformity  Heart:    Regular rate and rhythm, S1 and S2 normal, no murmur, rub   or gallop  Abdomen:     Soft, non-tender, bowel sounds active all four quadrants,    no masses, no organomegaly        Extremities:   Extremities normal, atraumatic, no cyanosis or edema  Pulses:   2+ and symmetric all extremities  Skin:   Skin color, texture, turgor normal, no rashes or lesions     There is some subjective aspect of possible neuropathy in the left foot.         Assessment:    Hypertension, normal blood pressure stick with meds. Evidence of target organ damage: none.    Plan:    Dietary sodium restriction. Regular aerobic exercise.  Continue medications Check met 7 due to renal insuff Follow up Renal insufficiency-recheck metabolic 7 if it's going up the patient will need intervention possible referral to renal specialist Peripheral neuropathy left foot-minimal but it's persistent patient would like to see neurology to have this evaluated I agree with him. He wonders if it is due to the statins.

## 2012-10-06 NOTE — Patient Instructions (Signed)
  Place appropriate patient instructions regarding hypertension here. 

## 2012-10-12 ENCOUNTER — Ambulatory Visit (INDEPENDENT_AMBULATORY_CARE_PROVIDER_SITE_OTHER): Payer: PRIVATE HEALTH INSURANCE | Admitting: Orthopedic Surgery

## 2012-10-12 ENCOUNTER — Ambulatory Visit: Payer: PRIVATE HEALTH INSURANCE

## 2012-10-12 VITALS — BP 148/88 | Ht 75.0 in | Wt 253.0 lb

## 2012-10-12 DIAGNOSIS — M25512 Pain in left shoulder: Secondary | ICD-10-CM

## 2012-10-12 DIAGNOSIS — M25519 Pain in unspecified shoulder: Secondary | ICD-10-CM

## 2012-10-12 NOTE — Patient Instructions (Addendum)
Impingement syndrome left shoulder   Capzacin apply before bed time

## 2012-10-13 NOTE — Progress Notes (Signed)
Patient ID: LAURIER JASPERSON, male   DOB: January 06, 1956, 57 y.o.   MRN: [id number] Chief Complaint  Patient presents with  . 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 He has pain when the air hits it it gets cold his pain is improved with ice and heat he feels a catching sensation in it. We've x-rayed this before he has some impingement type syndrome bilaterally.  He had history of back surgery knee surgery prostate surgery. 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 He has review of systems consistent with numbness and tingling in the same left upper extremity and also on the right there is question of carpal tunnel syndrome his remaining review of systems is normal  BP 148/88  Ht 6\' 3"  (1.905 m)  Wt 253 lb (114.76 kg)  BMI 31.62 kg/m2 Bodybuilding body habitus.General appearance is normal, the patient is alert and oriented x3 with normal mood and affect. Ambulation is normal.  He has painful for the elevation of the left shoulder 120 with positive Hawkins but not near maneuver apprehension is normal cuff strength mild weakness skin is intact pulses are good temperature is normal sensations normal as well.  His x-ray is noted to be normal from previous evaluation  Probable impingement syndrome cannot rule out partial tearing  Recommend subacromial injection relative rest and progressive return to activity if no improvement return in 6 weeks for repeat injection and consider MRI at that time  Subacromial Shoulder Injection Procedure Note  Pre-operative Diagnosis: left RC  Syndrome  Post-operative Diagnosis: same  Indications: pain   Anesthesia: ethyl chloride   Procedure Details   Verbal consent was obtained for the procedure. The shoulder was prepped withalcohol and the skin was anesthetized. A 20 gauge needle was advanced into the subacromial space through posterior approach without difficulty  The space was then injected with 3 ml 1% lidocaine and 1 ml of depomedrol. The injection site was cleansed with isopropyl alcohol and a dressing was applied.  Complications:  None; patient tolerated the procedure well.

## 2012-11-16 LAB — BASIC METABOLIC PANEL
Potassium: 3.6 mEq/L (ref 3.5–5.3)
Sodium: 142 mEq/L (ref 135–145)

## 2012-11-18 ENCOUNTER — Other Ambulatory Visit: Payer: Self-pay | Admitting: *Deleted

## 2012-11-18 MED ORDER — ROSUVASTATIN CALCIUM 20 MG PO TABS: 20.0000 mg | ORAL_TABLET | Freq: Every day | ORAL | Status: DC

## 2012-11-18 MED ORDER — METOPROLOL SUCCINATE ER 50 MG PO TB24: 50.0000 mg | ORAL_TABLET | Freq: Every day | ORAL | Status: DC

## 2012-11-18 MED ORDER — NIFEDIPINE ER OSMOTIC RELEASE 90 MG PO TB24: 90.0000 mg | ORAL_TABLET | Freq: Every day | ORAL | Status: DC

## 2012-11-24 ENCOUNTER — Telehealth: Payer: Self-pay | Admitting: *Deleted

## 2012-11-24 NOTE — Telephone Encounter (Signed)
Please look at forms on chart.

## 2012-11-24 NOTE — Telephone Encounter (Signed)
Office visit scheduled to discuss in detail.

## 2012-11-24 NOTE — Progress Notes (Signed)
Patient has office visit next week to discuss with Dr. Lorin Picket.

## 2012-11-24 NOTE — Telephone Encounter (Signed)
Please inform the patient that I reviewed overall of his information. Losartan is very unlikely to be the cause of his neuropathy. As per the patient's wishes we will stop it. He must take something in its place. I recommend Dyazide 37.5/25 one daily. He must followup within 2-3 weeks' time to recheck blood pressure. I also recommend that in approximately one week the patient check a metabolic 7 level as well as B12 level and thyroid function. We can discuss his neuropathy in more detail when he comes in for a visit. If the neuropathy is not good by the time he comes back this would indicate losartan very unlikely to be the source.

## 2012-11-25 ENCOUNTER — Ambulatory Visit (INDEPENDENT_AMBULATORY_CARE_PROVIDER_SITE_OTHER): Payer: PRIVATE HEALTH INSURANCE | Admitting: Orthopedic Surgery

## 2012-11-25 ENCOUNTER — Encounter: Payer: Self-pay | Admitting: Orthopedic Surgery

## 2012-11-25 VITALS — BP 123/81 | Ht 75.0 in | Wt 253.0 lb

## 2012-11-25 DIAGNOSIS — S43429A Sprain of unspecified rotator cuff capsule, initial encounter: Secondary | ICD-10-CM

## 2012-11-25 DIAGNOSIS — M75102 Unspecified rotator cuff tear or rupture of left shoulder, not specified as traumatic: Secondary | ICD-10-CM

## 2012-11-25 NOTE — Progress Notes (Signed)
Patient ID: [name] [name], male   DOB: 04-11-1956, 57 y.o.   MRN: [id number] Chief Complaint  Patient presents with  . Follow-up    6 week recheck on left shoulder for repeat injection if needed.    Continued anterior shoulder pain with pain when he is bench pressing pain with his arm extended away from his body when in the supine position.  There is tenderness over the biceps as it approaches the coracoid  Review of systems he denies any neck pain  BP 123/81  Ht 6\' 3"  (1.905 m)  Wt 253 lb (114.76 kg)  BMI 31.62 kg/m2 General appearance is normal, the patient is alert and oriented x3 with normal mood and affect. Gait normal No neurovascular deficits Left shoulder exam does not produce apprehension, but does produce impingement. He also has a slight amount of weakness with the empty can sign up for supraspinatus   I think he probably has either a labral tear, biceps tendinitis or partial cuff tear or combination along with bursitis and tendinosis of the rotator cuff  I reinjected him from an anterior approach tried to get the biceps tendon.  MRI left shoulder to planned surgical procedure for arthroscopy debridement and/or repair slap lesion plus or minus tenodesis if needed

## 2012-11-25 NOTE — Patient Instructions (Signed)
MRI ordered

## 2012-11-29 ENCOUNTER — Telehealth: Payer: Self-pay | Admitting: Radiology

## 2012-11-29 NOTE — Telephone Encounter (Signed)
Patient has MRI at [facility] on 12-02-12 at 5:45. Patient has [employer], no precert is needed. Patient will follow up back here in the office for his results.

## 2012-11-30 ENCOUNTER — Ambulatory Visit: Payer: PRIVATE HEALTH INSURANCE | Admitting: Family Medicine

## 2012-12-01 ENCOUNTER — Encounter: Payer: Self-pay | Admitting: Family Medicine

## 2012-12-01 ENCOUNTER — Ambulatory Visit (INDEPENDENT_AMBULATORY_CARE_PROVIDER_SITE_OTHER): Payer: PRIVATE HEALTH INSURANCE | Admitting: Family Medicine

## 2012-12-01 VITALS — BP 136/86 | Ht 73.25 in | Wt 256.0 lb

## 2012-12-01 DIAGNOSIS — Z79899 Other long term (current) drug therapy: Secondary | ICD-10-CM

## 2012-12-01 DIAGNOSIS — G5600 Carpal tunnel syndrome, unspecified upper limb: Secondary | ICD-10-CM

## 2012-12-01 NOTE — Progress Notes (Signed)
  Subjective:    Patient ID: [name] [name], male    DOB: 21-Mar-1956, 57 y.o.   MRN: [id number]  HPI  Patient arrives to discuss neuropathy. Wonders if it is related to any of his meds. Would also like to discuss the results of his recent labs. This patient comes in today to discuss neuropathy. He has tingling in his toes and intermittent tingling in both hands. He recently went and nerve conduction studies that did not show any major nerve compressions. The neurologist stated that it is possible that the patient could have small nerve damage this could be idiopathic. B12 level approximately a year ago was normal thyroid approximately year ago was normal patient at one time thought it was statins came off of this did not change things he is currently on Crestor then the patient states the pharmacist told him that losartan could be the cause of his neuropathy. Patient actually brought in a drug insert sheet regarding that losartan. When you read the details it shows that losartan had no greater occurrence of peripheral neuropathy than placebo. So therefore approximately 25-30 minutes minutes spent with the patient discussing neuropathy, options, what idiopathic neuropathy is, and discussing where to go from here. PMH hypertension, mild renal insufficiency, cholesterol issues Family history noncontributory social does not smoke he does do a lot of weight lifting and he uses a lot of protein. Review of Systems See above. Denies chest pain shortness breath swelling in the legs denies hematuria or hematochezia denies headaches does have intermittent numbness into the hands    Objective:   Physical Exam Neck no masses lungs are clear no crackles respiratory rate normal heart is regular no murmurs pulses are normal extremities no edema Blood pressure recheck very good 128/74       Assessment & Plan:  #1 HTN good control continue current measures #2 renal insufficiency reduced protein intake. 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

## 2012-12-01 NOTE — Patient Instructions (Signed)
Renal Diet, Pre-Dialysis °Chronic kidney disease (CKD) is when the kidneys are not working the way they should. There are 5 stages of kidney disease. Your meal plan will depend on the stage you are diagnosed with. Food can make you healthier and keep your kidneys working well. Use this sheet to help you learn how to eat right to feel right.  °A Registered Dietitian can help you with this meal plan. Write down your dietician's name and phone number. °HOW DOES FOOD AFFECT MY KIDNEYS? °Food gives you energy and helps your body repair itself. Food is broken down in your stomach and intestines. Your blood picks up nutrients from the digested food and carries them to all your body cells. These cells take nutrients from your blood and put waste products back into the bloodstream. When your kidneys were healthy, they constantly removed wastes from your blood. The wastes left your body when you urinated or when you had bowel movements. °Now that your kidneys cannot remove wastes from food, you will need to eat fewer foods that cause waste buildup in the body.  °FLUIDS °Most people with CKD do not need to restrict fluid. However, if you are retaining fluid in your legs, your caregiver may recommend restricting fluids. °Ask and record the amount of fluid you can have each day if instructed by your caregiver. °POTASSIUM  °Potassium is a mineral found in many foods, especially milk, fruits, and vegetables. It affects how steadily your heart beats. Healthy kidneys keep the right amount of potassium in the blood to keep the heart beating at a steady pace. You may need to restrict potassium. Talk to your caregiver or dietitian to learn more about your potassium needs. °If you do need to reduce the potassium in your diet, start by noting the high-potassium foods (below) that you now eat. A dietitian can help you add other foods to the list and tell you how much of the foods below to eat.  °High-Potassium  Foods °· Apricots. °· Brussels sprouts. °· Dates. °· Lima beans. °· Oranges. °· Prune juice. °· Spinach. °· Avocados. °· Milk. °· Figs. °· Melons. °· Peanuts. °· Prunes. °· Tomatoes. °· Bananas. °· Cantaloupe. °· Kiwi fruit. °· Nectarines. °· Asparagus spears. °· Raisins. °· Winter squash. °· Beets. °· Clams. °· Fruit. °· Orange juice. °· Potatoes. °· Sardines. °· Yogurt. °PHOSPHORUS °· Phosphorus is a mineral found in many foods. If you have too much phosphorus in your blood, it pulls calcium from your bones. Losing calcium will make your bones weak and likely to break. Also, too much phosphorus may make your skin itch. Avoid foods like milk and cheese, dried beans, peas, colas, nuts, and peanut butter, which are high in phosphorus. °· Your needs will depend on your kidney's ability to use phosphorous. Your dietitian can tell you how much of these foods to eat. °PROTEIN °· It is important to follow a low-protein diet. A lower protein diet will slow the rate of your kidney failure. °· Protein helps you keep muscle and repair tissue. In your body, the protein you eat breaks down into a waste product called urea. If urea builds up in your blood, you can become very sick. °Ask and record how many servings of meat, fish, chicken, or eggs you may eat per day. Your dietitian may give you a specific number of grams of protein to eat daily. One ounce of meat, fish, chicken, or 1 egg is equal to 7 g. A regular serving size is about   the size of the palm of your hand or a deck of cards and is 3 oz in weight and 21 g of protein.   °SODIUM °· Sodium is found in salt and other foods. Most canned foods and frozen dinners contain large amounts of sodium. °· Try to eat fresh foods that are naturally low in sodium. Look for products labeled "low sodium." °· Do not use salt substitutes because they contain potassium. Talk to a dietitian about spices you can use to flavor your food. The dietitian can help you find spice blends without  sodium or potassium. °VITAMINS AND MINERALS  °· Take only the vitamins that your caregiver prescribes. °· Vitamins and minerals may be missing from your diet because you need to avoid so many foods. Your caregiver may prescribe a vitamin and mineral supplement to meet your needs. °Document Released: 06/28/2002 Document Revised: 06/30/2011 Document Reviewed: 07/05/2010 °[employer]® Patient Information ©2014 [employer]. ° °

## 2012-12-02 ENCOUNTER — Ambulatory Visit
Admission: RE | Admit: 2012-12-02 | Discharge: 2012-12-02 | Disposition: A | Discharge status: Home / Self Care | Payer: PRIVATE HEALTH INSURANCE | Source: Ambulatory Visit | Attending: Orthopedic Surgery | Admitting: Orthopedic Surgery

## 2012-12-02 DIAGNOSIS — M75102 Unspecified rotator cuff tear or rupture of left shoulder, not specified as traumatic: Secondary | ICD-10-CM

## 2012-12-09 ENCOUNTER — Encounter: Payer: Self-pay | Admitting: Orthopedic Surgery

## 2012-12-09 ENCOUNTER — Ambulatory Visit (INDEPENDENT_AMBULATORY_CARE_PROVIDER_SITE_OTHER): Payer: PRIVATE HEALTH INSURANCE | Admitting: Orthopedic Surgery

## 2012-12-09 VITALS — BP 128/79 | Ht 75.0 in | Wt 253.0 lb

## 2012-12-09 DIAGNOSIS — S43429A Sprain of unspecified rotator cuff capsule, initial encounter: Secondary | ICD-10-CM

## 2012-12-09 DIAGNOSIS — M75102 Unspecified rotator cuff tear or rupture of left shoulder, not specified as traumatic: Secondary | ICD-10-CM

## 2012-12-09 DIAGNOSIS — M75100 Unspecified rotator cuff tear or rupture of unspecified shoulder, not specified as traumatic: Secondary | ICD-10-CM | POA: Insufficient documentation

## 2012-12-09 NOTE — Progress Notes (Signed)
Patient ID: [name] [name], male   DOB: May 30, 1955, 57 y.o.   MRN: [id number]  Chief Complaint  Patient presents with  . Results    Review MRI results Left shoulder    History the patient continues to have pain in his left shoulder but is much. After his second injection anteriorly near the biceps area. He still cannot do his bench pressing like he would like he did okay with the dumbbells  Review of systems no catching locking or giving way  Range of motion is returned to near normal his shoulder is stable his cuff strength which to manual muscle testing is intact skin is normal there is no swelling pulses good sensation is normal  MRI review shows a tear of the rotator cuff with mild retraction  We discussed his treatment options such as MRI again in 6 months if he decides nonoperative treatment or operative treatment. He is to think about a calls back if he wants an arthroscopic assisted rotator cuff repair

## 2012-12-09 NOTE — Patient Instructions (Addendum)
Avoid bench pressing   If you have surgery:   3 weeks in a sling  Return to all activity by first of the year

## 2012-12-19 ENCOUNTER — Other Ambulatory Visit: Payer: Self-pay | Admitting: Family Medicine

## 2013-01-18 ENCOUNTER — Other Ambulatory Visit: Payer: Self-pay | Admitting: Family Medicine

## 2013-01-26 ENCOUNTER — Other Ambulatory Visit: Payer: Self-pay | Admitting: Family Medicine

## 2013-01-26 NOTE — Progress Notes (Signed)
Patient had lab work done at the [place], creatinine 1.44. He will followup later this fall with [place].

## 2013-02-18 ENCOUNTER — Other Ambulatory Visit: Payer: Self-pay | Admitting: Family Medicine

## 2013-03-21 ENCOUNTER — Other Ambulatory Visit: Payer: Self-pay | Admitting: Family Medicine

## 2013-04-28 ENCOUNTER — Ambulatory Visit: Payer: PRIVATE HEALTH INSURANCE | Admitting: Family Medicine

## 2013-05-03 ENCOUNTER — Encounter: Payer: Self-pay | Admitting: Family Medicine

## 2013-05-03 ENCOUNTER — Ambulatory Visit (INDEPENDENT_AMBULATORY_CARE_PROVIDER_SITE_OTHER): Payer: PRIVATE HEALTH INSURANCE | Admitting: Family Medicine

## 2013-05-03 VITALS — BP 140/90 | Ht 73.25 in | Wt 265.0 lb

## 2013-05-03 DIAGNOSIS — I10 Essential (primary) hypertension: Secondary | ICD-10-CM

## 2013-05-03 NOTE — Progress Notes (Signed)
   Subjective:    Patient ID: [name] [name], male    DOB: [birth year], 58 y.o.   MRN: [id number]  HPI Patient is here today for a check up. He was last seen in August.   He states his carpal tunnel is the same. He is used to it, so it doesn't bother him anymore.   He has no concerns. She relates taking his medicines. [facility] with [facility] they did extensive lab work in October this was reviewed with the patient. He does do some exercise although not as much as he could  PMH benign, HTN, arthritis   Review of Systems He denies chest tightness pressure pain vomiting hematuria rectal bleeding denies sweats chills.    Objective:   Physical Exam Blood pressure was rechecked. Even on recheck 140/90 then 134/90 in addition to this lungs are clear hearts regular extremities no edema skin warm dry     Assessment & Plan:  #1 HTN fair control encouraged him to eat a heart healthy diet more cardio exercise check blood pressure intermittently send [place] some readings in addition to this to do 24-hour urine assessment some protein and creatinine clearance await the results of this.

## 2013-05-23 ENCOUNTER — Other Ambulatory Visit: Payer: Self-pay | Admitting: Family Medicine

## 2013-06-06 LAB — PROTEIN, URINE, 24 HOUR
PROTEIN 24H UR: 48 mg/d — AB (ref 50–100)
Protein, Urine: 3 mg/dL

## 2013-06-06 LAB — CREATININE CLEARANCE, URINE, 24 HOUR
CREAT CLEAR: 102 mL/min (ref 75–125)
CREATININE: 1.37 mg/dL — AB (ref 0.50–1.35)
Creatinine, 24H Ur: 2005 mg/d — ABNORMAL HIGH (ref 800–2000)
Creatinine, Urine: 125.3 mg/dL

## 2013-06-06 LAB — BASIC METABOLIC PANEL
BUN: 14 mg/dL (ref 6–23)
CALCIUM: 8.9 mg/dL (ref 8.4–10.5)
CHLORIDE: 104 meq/L (ref 96–112)
CO2: 34 mEq/L — ABNORMAL HIGH (ref 19–32)
CREATININE: 1.37 mg/dL — AB (ref 0.50–1.35)
Glucose, Bld: 117 mg/dL — ABNORMAL HIGH (ref 70–99)
Potassium: 3.4 mEq/L — ABNORMAL LOW (ref 3.5–5.3)
Sodium: 143 mEq/L (ref 135–145)

## 2013-06-08 ENCOUNTER — Other Ambulatory Visit: Payer: Self-pay | Admitting: *Deleted

## 2013-06-08 DIAGNOSIS — E876 Hypokalemia: Secondary | ICD-10-CM

## 2013-06-08 MED ORDER — POTASSIUM CHLORIDE CRYS ER 20 MEQ PO TBCR: 20.0000 meq | EXTENDED_RELEASE_TABLET | Freq: Two times a day (BID) | ORAL | Status: DC

## 2013-06-10 NOTE — Addendum Note (Signed)
Addended by: Dairl Ponder on: 06/10/2013 11:22 AM   Modules accepted: Orders

## 2013-06-20 ENCOUNTER — Telehealth: Payer: Self-pay | Admitting: Family Medicine

## 2013-06-20 NOTE — Telephone Encounter (Signed)
Please tell the patient I reviewed over his readings. The majority of his readings are higher than what I would like to see. I would like to see his systolic number in the 683M her last period I would also like to see his diastolic in the 19Q or less. I would recommend adding Cardura 2 mg one each evening. #34 refills. Keep followup appointment (patient was seen a couple months ago should followup by may)

## 2013-06-20 NOTE — Telephone Encounter (Signed)
See note attached to chart

## 2013-06-21 MED ORDER — DOXAZOSIN MESYLATE 2 MG PO TABS: 2.0000 mg | ORAL_TABLET | Freq: Every day | ORAL | Status: DC

## 2013-06-21 NOTE — Telephone Encounter (Signed)
Patient notified and verbalized understanding. I transferred him up front to make f/u appt in May.

## 2013-06-22 ENCOUNTER — Other Ambulatory Visit: Payer: Self-pay | Admitting: Family Medicine

## 2013-07-19 ENCOUNTER — Telehealth: Payer: Self-pay | Admitting: Family Medicine

## 2013-07-19 NOTE — Telephone Encounter (Signed)
[name] needs form attached to patients chart completed before tomorrow. He has an appointment tomorrow with them.

## 2013-07-27 ENCOUNTER — Encounter: Payer: Self-pay | Admitting: Family Medicine

## 2013-07-27 ENCOUNTER — Ambulatory Visit (INDEPENDENT_AMBULATORY_CARE_PROVIDER_SITE_OTHER): Payer: PRIVATE HEALTH INSURANCE | Admitting: Family Medicine

## 2013-07-27 VITALS — BP 142/90 | Ht 73.25 in | Wt 268.0 lb

## 2013-07-27 DIAGNOSIS — I10 Essential (primary) hypertension: Secondary | ICD-10-CM

## 2013-07-27 MED ORDER — DOXAZOSIN MESYLATE 4 MG PO TABS: 4.0000 mg | ORAL_TABLET | Freq: Every day | ORAL | Status: DC

## 2013-07-27 NOTE — Progress Notes (Signed)
   Subjective:    Patient ID: [name] [name], male    DOB: June 27, 1955, 58 y.o.   MRN: [id number]  HPI Patient is here today for a f/u on his HTN. He is trying to follow diet try to avoid excessive salt.  He had BW done in Feb. potassium slightly low patient states he is taking potassium supplement he is on a diuretic.  He has no concerns/questions.    Review of Systems  Constitutional: Negative for activity change, appetite change and fatigue.  Respiratory: Negative for chest tightness.   Cardiovascular: Negative for chest pain.  Gastrointestinal: Negative for abdominal pain.  Endocrine: Negative for polydipsia and polyphagia.  Neurological: Negative for weakness.  Psychiatric/Behavioral: Negative for confusion.       Objective:   Physical Exam  Vitals reviewed. Constitutional: He appears well-nourished. No distress.  Cardiovascular: Normal rate, regular rhythm and normal heart sounds.   No murmur heard. Pulmonary/Chest: Effort normal and breath sounds normal. No respiratory distress.  Musculoskeletal: He exhibits no edema.  Lymphadenopathy:    He has no cervical adenopathy.  Neurological: He is alert.  Psychiatric: His behavior is normal.          Assessment & Plan:  Hypertension-there is some very interesting issues going on here with the patient. I am concerned with his blood pressure. 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 The only way we can do that as by using Cardura. 4 mg per day recommended. Unfortunately this medication cannot be used with Cialis so therefore it was told to do pharmacist he needs to stop the Cialis. The patient will call and followup with [place] regarding this issue. Especially if he wants to be continuing the Cialis he cannot take the Cardura and would have to go back on the nifedipine. We will follow his creatinine closely if he gets worse I recommend we get  him involved with the nephrologist.

## 2013-07-31 NOTE — Telephone Encounter (Signed)
ok 

## 2013-08-04 ENCOUNTER — Other Ambulatory Visit: Payer: Self-pay | Admitting: Family Medicine

## 2013-08-04 ENCOUNTER — Telehealth: Payer: Self-pay | Admitting: Family Medicine

## 2013-08-04 DIAGNOSIS — I10 Essential (primary) hypertension: Secondary | ICD-10-CM

## 2013-08-04 NOTE — Telephone Encounter (Signed)
I discussed the case with the patient. He is going back on his original regimen. We have put into the system for him to see cardiology for consultation regarding his hypertension and his medications. He also needs to have the following tests set up. He needs plasma metanephrines. He also needs a renal ultrasound. Also needs plasma aldosterone/renin ratio. It is best for this blood work to be a a.m. draw. It is best for him to get these test completed before seeing cardiology. So therefore go ahead and set these up call the patient. The patient is aware that he is having to do tests ultrasound and consultation with cardiology.

## 2013-08-05 ENCOUNTER — Encounter: Payer: Self-pay | Admitting: Family Medicine

## 2013-08-05 NOTE — Telephone Encounter (Addendum)
Blood work orders placed in [facility]. Ultrasound scheduled. Patient notified.

## 2013-08-05 NOTE — Addendum Note (Signed)
Addended by: Dairl Ponder on: 08/05/2013 02:52 PM   Modules accepted: Orders

## 2013-08-10 ENCOUNTER — Ambulatory Visit (HOSPITAL_COMMUNITY)
Admission: RE | Admit: 2013-08-10 | Discharge: 2013-08-10 | Disposition: A | Discharge status: Home / Self Care | Payer: PRIVATE HEALTH INSURANCE | Source: Ambulatory Visit | Attending: Family Medicine | Admitting: Family Medicine

## 2013-08-10 DIAGNOSIS — I10 Essential (primary) hypertension: Secondary | ICD-10-CM | POA: Insufficient documentation

## 2013-08-11 LAB — METANEPHRINES, PLASMA
Metanephrine, Free: 31 pg/mL (ref <=57)
NORMETANEPHRINE FREE: 102 pg/mL (ref <=148)
TOTAL METANEPHRINES-PLASMA: 133 pg/mL (ref <=205)

## 2013-08-14 LAB — ALDOSTERONE + RENIN ACTIVITY W/ RATIO
ALDO / PRA Ratio: 8.6 Ratio (ref 0.9–28.9)
Aldosterone: 16 ng/dL
PRA LC/MS/MS: 1.85 ng/mL/h (ref 0.25–5.82)

## 2013-08-16 NOTE — Telephone Encounter (Signed)
Patient notified and verbalized understanding of the test results. No further questions. 

## 2013-08-17 ENCOUNTER — Telehealth: Payer: Self-pay | Admitting: Family Medicine

## 2013-08-17 NOTE — Telephone Encounter (Signed)
Please clarify, see result note on pt's most recent labs.  Do those results go with the cardiology referral or is there supposed to be another referral to a different specialist (urology or nephrology)? The only referral in system is for cardiology & it's completed.  Nurse told pt we'd call with appointment, need to know if it's an additional referral so I may process and or notify pt, please advise

## 2013-08-17 NOTE — Telephone Encounter (Signed)
Only referral currently is cardiology

## 2013-08-18 NOTE — Telephone Encounter (Signed)
LMOM to notify pt of Cardiology appt

## 2013-08-20 ENCOUNTER — Other Ambulatory Visit: Payer: Self-pay | Admitting: Family Medicine

## 2013-08-22 ENCOUNTER — Ambulatory Visit: Payer: PRIVATE HEALTH INSURANCE | Admitting: Family Medicine

## 2013-08-23 ENCOUNTER — Encounter: Payer: Self-pay | Admitting: Cardiovascular Disease

## 2013-08-23 ENCOUNTER — Ambulatory Visit (INDEPENDENT_AMBULATORY_CARE_PROVIDER_SITE_OTHER): Payer: PRIVATE HEALTH INSURANCE | Admitting: Cardiovascular Disease

## 2013-08-23 VITALS — BP 140/84 | HR 64 | Ht 75.0 in | Wt 255.0 lb

## 2013-08-23 DIAGNOSIS — I10 Essential (primary) hypertension: Secondary | ICD-10-CM

## 2013-08-23 NOTE — Patient Instructions (Signed)
Your physician recommends that you schedule a follow-up appointment in: 3-4 weeks   Please bring BP log with you    Your physician recommends that you continue on your current medications as directed. Please refer to the Current Medication list given to you today.     Thank you for choosing [facility] !

## 2013-08-23 NOTE — Progress Notes (Signed)
Patient ID: [name] [name], male   DOB: 12-08-1955, 58 y.o.   MRN: [id number]       CARDIOLOGY CONSULT NOTE  Patient ID: [name] [name] MRN: [id number] DOB/AGE: 12/16/1955 58 y.o.  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 He is currently taking Cardura, Hyzaar, metoprolol, and nifedipine. Apparently, nifedipine caused gingival hyperplasia and a dentist told him to stop this. He is also taking Cialis and a pharmacist reportedly told him to stop this, given his concomitant use of Cardura. Plasma aldosterone and renin levels were checked and were found to be normal. Plasma metanephrines were also normal, as was a renal ultrasound. In February, renal function was noted to be impaired with BUN 14 and creatinine 1.37. He was also noted to be hypokalemic with a potassium of 3.4, and has been on daily supplementation. ECG today shows normal sinus rhythm with no electrocardiographic evidence of left ventricular hypertrophy. PR interval was 212 ms with a normal corrected QT interval. He denies chest pain, shortness of breath, palpitations, diaphoresis, lightheadedness, dizziness and syncope. He never notices any significant leg swelling, but his wife may tell him from time to time that his ankles and feet appear swollen. He uses both the elliptical and treadmill and has not noticed any diminished capacity with regards to this. However, he used to be able to bench 360 pounds and now struggles to bench 300 lbs. He does not cook with salt and rarely eats fast food or potato chips. He checks his blood pressure sporadically both at local pharmacies and at home. This morning his blood pressure was 157/89, but was 123/70 yesterday evening.    No Known Allergies  Current Outpatient Prescriptions  Medication Sig Dispense Refill  . aspirin 325 MG  tablet Take 325 mg by mouth daily.      Marland Kitchen CIALIS 5 MG tablet TAKE ONE (1) TABLET EACH DAY  30 tablet  3  . CRESTOR 20 MG tablet TAKE ONE (1) TABLET EACH DAY  30 tablet  5  . doxazosin (CARDURA) 4 MG tablet Take 2 mg by mouth daily.      Marland Kitchen HM OMEPRAZOLE 20 MG TBEC TAKE ONE (1) TABLET EACH DAY  28 each  2  . ibuprofen (ADVIL,MOTRIN) 600 MG tablet TAKE ONE TABLET THREE TIMES A DAY FOR 7 DAYS THEN TAKE ONE TABLET THREE TIMES A DAY AS NEEDED  60 tablet  4  . losartan-hydrochlorothiazide (HYZAAR) 100-25 MG per tablet TAKE ONE (1) TABLET EACH DAY  30 tablet  2  . metoprolol succinate (TOPROL-XL) 50 MG 24 hr tablet TAKE ONE (1) TABLET EACH DAY  30 tablet  5  . Multiple Vitamin (MULTIVITAMIN) capsule Take 1 capsule by mouth daily.        Marland Kitchen NIFEdipine (PROCARDIA XL/ADALAT-CC) 90 MG 24 hr tablet TAKE ONE (1) TABLET EACH DAY  30 tablet  5  . potassium chloride SA (K-DUR,KLOR-CON) 20 MEQ tablet Take 1 tablet (20 mEq total) by mouth 2 (two) times daily.  60 tablet  2   No current facility-administered medications for this visit.    Past Medical History  Diagnosis Date  . HTN (hypertension)   . Hyperlipemia   . Adenomatous colon polyp 2007 [place]  . GERD (gastroesophageal reflux disease)   . Prostate ca 2004  . DDD (degenerative disc disease)   . Herniated disc  Back    Past Surgical History  Procedure Laterality Date  . Laminectomy and microdiscectomy lumbar spine  2002  . Knee arthroscopy w/ meniscectomy  2011 RIGHT  . Colonoscopy  11/15/2010    Procedure: COLONOSCOPY;  Surgeon: Dorothyann Peng, MD;  Location: [facility];  Service: Endoscopy;  Laterality: N/A;    History   Social History  . Marital Status: Married    Spouse Name: N/A    Number of Children: N/A  . Years of Education: N/A   Occupational History  . Not on file.   Social History Main Topics  . Smoking status: Never Smoker   . Smokeless tobacco: Not on file  . Alcohol Use: No  . Drug Use: No  . Sexual Activity: Not  on file   Other Topics Concern  . Not on file   Social History Narrative  . No narrative on file     No family history of premature CAD in 1st degree relatives.  Prior to Admission medications   Medication Sig Start Date End Date Taking? Authorizing Provider  aspirin 325 MG tablet Take 325 mg by mouth daily.   Yes Historical Provider, MD  CIALIS 5 MG tablet TAKE ONE (1) TABLET EACH DAY 02/18/13  Yes Kathyrn Drown, MD  CRESTOR 20 MG tablet TAKE ONE (1) TABLET EACH DAY   Yes Kathyrn Drown, MD  doxazosin (CARDURA) 4 MG tablet Take 2 mg by mouth daily. 07/27/13  Yes Kathyrn Drown, MD  HM OMEPRAZOLE 20 MG TBEC TAKE ONE (1) TABLET EACH DAY 06/22/13  Yes Kathyrn Drown, MD  ibuprofen (ADVIL,MOTRIN) 600 MG tablet TAKE ONE TABLET THREE TIMES A DAY FOR 7 DAYS THEN TAKE ONE TABLET THREE TIMES A DAY AS NEEDED 12/19/12  Yes Kathyrn Drown, MD  losartan-hydrochlorothiazide (HYZAAR) 100-25 MG per tablet TAKE ONE (1) TABLET EACH DAY 06/22/13  Yes Kathyrn Drown, MD  metoprolol succinate (TOPROL-XL) 50 MG 24 hr tablet TAKE ONE (1) TABLET EACH DAY   Yes Kathyrn Drown, MD  Multiple Vitamin (MULTIVITAMIN) capsule Take 1 capsule by mouth daily.     Yes Historical Provider, MD  NIFEdipine (PROCARDIA XL/ADALAT-CC) 90 MG 24 hr tablet TAKE ONE (1) TABLET EACH DAY   Yes Kathyrn Drown, MD  potassium chloride SA (K-DUR,KLOR-CON) 20 MEQ tablet Take 1 tablet (20 mEq total) by mouth 2 (two) times daily. 06/08/13  Yes Kathyrn Drown, MD     Review of systems complete and found to be negative unless listed above in HPI     Physical exam Blood pressure 140/84, pulse 64, height 6\' 3"  (1.905 m), weight 255 lb (115.667 kg). General: NAD Neck: No JVD, no thyromegaly or thyroid nodule.  Lungs: Clear to auscultation bilaterally with normal respiratory effort. CV: Nondisplaced PMI.  Heart regular S1/S2, no S3/S4, no murmur.  No peripheral edema.  No carotid bruit.  Normal pedal pulses.  Abdomen: Soft, nontender, no  hepatosplenomegaly, no distention.  Skin: Intact without lesions or rashes.  Neurologic: Alert and oriented x 3.  Psych: Normal affect. Extremities: No clubbing or cyanosis.  HEENT: Normal.   Labs:   Lab Results  Component Value Date   WBC 4.3 12/21/2009   HGB 13.0 12/21/2009   HCT 38.7* 12/21/2009   MCV 88.5 12/21/2009   PLT 198 12/21/2009   No results found for this basename: NA, K, CL, CO2, BUN, CREATININE, CALCIUM, LABALBU, PROT, BILITOT, ALKPHOS, ALT, AST, GLUCOSE,  in the last 168  hours No results found for this basename: CKTOTAL, CKMB, CKMBINDEX, TROPONINI    No results found for this basename: CHOL   No results found for this basename: HDL   No results found for this basename: LDLCALC   No results found for this basename: TRIG   No results found for this basename: CHOLHDL   No results found for this basename: LDLDIRECT         Studies: No results found.  ASSESSMENT AND PLAN:  1. Malignant hypertension: Other than a diminished capacity to bench press as much weight as he used to, there are no significant symptoms reported. Again, as noted above, plasma renin, aldosterone, and metanephrines were all within normal limits. 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 Renal ultrasound was normal and reassuring. I did not appreciate any abdominal bruits, but may consider renal artery Dopplers in the future. I may also consider an echocardiogram to evaluate for hypertensive heart disease and to assess diastolic function in the future. For the time being, I have asked him to check his blood pressure 5 times per week over the next 3-4 weeks, and at different times of the day. Once I assess what his average blood pressures are, I can further titrate his antihypertensive therapy.  Dispo: f/u 3-4 weeks.  Signed: Kate Sable, M.D., F.A.C.C.  08/23/2013, 8:37 AM

## 2013-09-06 ENCOUNTER — Ambulatory Visit (INDEPENDENT_AMBULATORY_CARE_PROVIDER_SITE_OTHER): Payer: PRIVATE HEALTH INSURANCE | Admitting: Urology

## 2013-09-06 DIAGNOSIS — N529 Male erectile dysfunction, unspecified: Secondary | ICD-10-CM

## 2013-09-20 ENCOUNTER — Other Ambulatory Visit: Payer: Self-pay | Admitting: Family Medicine

## 2013-09-21 ENCOUNTER — Ambulatory Visit: Payer: PRIVATE HEALTH INSURANCE | Admitting: Cardiovascular Disease

## 2013-10-23 ENCOUNTER — Other Ambulatory Visit: Payer: Self-pay | Admitting: Family Medicine

## 2013-10-26 ENCOUNTER — Encounter: Payer: Self-pay | Admitting: Family Medicine

## 2013-10-26 ENCOUNTER — Ambulatory Visit (INDEPENDENT_AMBULATORY_CARE_PROVIDER_SITE_OTHER): Payer: PRIVATE HEALTH INSURANCE | Admitting: Family Medicine

## 2013-10-26 VITALS — BP 130/88 | Ht 73.25 in | Wt 276.1 lb

## 2013-10-26 DIAGNOSIS — I10 Essential (primary) hypertension: Secondary | ICD-10-CM

## 2013-10-26 DIAGNOSIS — Z79899 Other long term (current) drug therapy: Secondary | ICD-10-CM

## 2013-10-26 DIAGNOSIS — N529 Male erectile dysfunction, unspecified: Secondary | ICD-10-CM | POA: Insufficient documentation

## 2013-10-26 DIAGNOSIS — E785 Hyperlipidemia, unspecified: Secondary | ICD-10-CM

## 2013-10-26 DIAGNOSIS — N5231 Erectile dysfunction following radical prostatectomy: Secondary | ICD-10-CM

## 2013-10-26 NOTE — Progress Notes (Signed)
   Subjective:    Patient ID: [name] [name], male    DOB: 01-22-1956, 58 y.o.   MRN: [id number]  Hypertension This is a chronic problem. The current episode started more than 1 year ago. The problem has been gradually improving since onset. The problem is controlled. Pertinent negatives include no chest pain. There are no associated agents to hypertension. There are no known risk factors for coronary artery disease. Treatments tried: losartan-hctz, metoprolol. The current treatment provides significant improvement. There are no compliance problems.    Patient states that he has no concerns at this time.  He is exercising watching his diet taking his medicines as directed he denies having any problems he states he did have a followup appointment with the specialist cardiology but does not want to keep that appointment at this time.  Review of Systems  Constitutional: Negative for activity change, appetite change and fatigue.  HENT: Negative for congestion.   Respiratory: Negative for cough.   Cardiovascular: Negative for chest pain.  Gastrointestinal: Negative for abdominal pain.  Endocrine: Negative for polydipsia and polyphagia.  Neurological: Negative for weakness.  Psychiatric/Behavioral: Negative for confusion.       Objective:   Physical Exam  Vitals reviewed. Constitutional: He appears well-nourished. No distress.  Cardiovascular: Normal rate, regular rhythm and normal heart sounds.   No murmur heard. Pulmonary/Chest: Effort normal and breath sounds normal. No respiratory distress.  Musculoskeletal: He exhibits no edema.  Lymphadenopathy:    He has no cervical adenopathy.  Neurological: He is alert.  Psychiatric: His behavior is normal.          Assessment & Plan:  HTN-this patient's blood pressure actually is very good for him. 138/80. He states his readings for the most part, been good. He will continue his current medications. In addition to this he will check  metabolic 7 await the results  Renal insufficiency recheck metabolic 7 if it is worsening consider renal artery scanning./Ultrasound.  Erectile dysfunction he is interested in knowing what his testosterone level but he also states that he does not want to be on testosterone we will honor his wishes and check a testosterone level.  He does need cholesterol profile checked await the result.

## 2013-10-28 ENCOUNTER — Telehealth: Payer: Self-pay | Admitting: Family Medicine

## 2013-10-28 LAB — BASIC METABOLIC PANEL
BUN: 20 mg/dL (ref 6–23)
CHLORIDE: 105 meq/L (ref 96–112)
CO2: 28 meq/L (ref 19–32)
CREATININE: 1.47 mg/dL — AB (ref 0.50–1.35)
Calcium: 8.6 mg/dL (ref 8.4–10.5)
GLUCOSE: 92 mg/dL (ref 70–99)
Potassium: 3.4 mEq/L — ABNORMAL LOW (ref 3.5–5.3)
Sodium: 142 mEq/L (ref 135–145)

## 2013-10-28 LAB — LIPID PANEL
CHOLESTEROL: 152 mg/dL (ref 0–200)
HDL: 48 mg/dL (ref >39)
LDL Cholesterol: 93 mg/dL (ref 0–99)
TRIGLYCERIDES: 53 mg/dL (ref <150)
Total CHOL/HDL Ratio: 3.2 Ratio
VLDL: 11 mg/dL (ref 0–40)

## 2013-10-28 LAB — TESTOSTERONE: Testosterone: 252 ng/dL — ABNORMAL LOW (ref 300–890)

## 2013-10-28 NOTE — Telephone Encounter (Signed)
See attached to chart BP tracker

## 2013-10-30 NOTE — Telephone Encounter (Signed)
This report was reviewed. Blood pressures for the most part look good. Bottom number occasionally high top number occasionally high patient was just seen had good numbers at that visit and is RE scheduling for followup. Will scan report into the system.

## 2013-11-01 ENCOUNTER — Ambulatory Visit: Payer: PRIVATE HEALTH INSURANCE | Admitting: Cardiovascular Disease

## 2013-11-01 ENCOUNTER — Other Ambulatory Visit: Payer: Self-pay | Admitting: *Deleted

## 2013-11-01 MED ORDER — POTASSIUM CHLORIDE CRYS ER 20 MEQ PO TBCR: EXTENDED_RELEASE_TABLET | ORAL | Status: DC

## 2013-11-21 ENCOUNTER — Other Ambulatory Visit: Payer: Self-pay | Admitting: Family Medicine

## 2013-12-15 ENCOUNTER — Encounter: Payer: Self-pay | Admitting: Gastroenterology

## 2014-01-23 ENCOUNTER — Other Ambulatory Visit: Payer: Self-pay | Admitting: Family Medicine

## 2014-01-25 ENCOUNTER — Telehealth: Payer: Self-pay | Admitting: Family Medicine

## 2014-01-25 DIAGNOSIS — R5383 Other fatigue: Secondary | ICD-10-CM

## 2014-01-25 DIAGNOSIS — Z79899 Other long term (current) drug therapy: Secondary | ICD-10-CM

## 2014-01-25 NOTE — Telephone Encounter (Signed)
See chart for [facility] bw results

## 2014-01-28 NOTE — Telephone Encounter (Signed)
#  1 have the patient repeat metabolic 7 make sure he is well hydrated on that day. It does not need to be fasting. #2 have the patient repeat CBC and also check ferritin, TIBC. Nurses, the reason for these lab work is to follow up on labs he had through the [place]

## 2014-01-30 NOTE — Telephone Encounter (Signed)
LMRC

## 2014-01-30 NOTE — Telephone Encounter (Signed)
Notified patient that bloodwork has been ordered and to please be well hydrated when going to the lab. Patient verbalized understanding.

## 2014-02-11 LAB — CBC WITH DIFFERENTIAL/PLATELET
BASOS ABS: 0 10*3/uL (ref 0.0–0.1)
Basophils Relative: 1 % (ref 0–1)
Eosinophils Absolute: 0.1 10*3/uL (ref 0.0–0.7)
Eosinophils Relative: 3 % (ref 0–5)
HCT: 40.1 % (ref 39.0–52.0)
Hemoglobin: 13.3 g/dL (ref 13.0–17.0)
LYMPHS PCT: 48 % — AB (ref 12–46)
Lymphs Abs: 2 10*3/uL (ref 0.7–4.0)
MCH: 29.4 pg (ref 26.0–34.0)
MCHC: 33.2 g/dL (ref 30.0–36.0)
MCV: 88.5 fL (ref 78.0–100.0)
Monocytes Absolute: 0.2 10*3/uL (ref 0.1–1.0)
Monocytes Relative: 5 % (ref 3–12)
NEUTROS PCT: 43 % (ref 43–77)
Neutro Abs: 1.8 10*3/uL (ref 1.7–7.7)
PLATELETS: 193 10*3/uL (ref 150–400)
RBC: 4.53 MIL/uL (ref 4.22–5.81)
RDW: 15.9 % — ABNORMAL HIGH (ref 11.5–15.5)
WBC: 4.1 10*3/uL (ref 4.0–10.5)

## 2014-02-11 LAB — IRON AND TIBC
%SAT: 28 % (ref 20–55)
IRON: 70 ug/dL (ref 42–165)
TIBC: 249 ug/dL (ref 215–435)
UIBC: 179 ug/dL (ref 125–400)

## 2014-02-11 LAB — BASIC METABOLIC PANEL
BUN: 20 mg/dL (ref 6–23)
CALCIUM: 8.5 mg/dL (ref 8.4–10.5)
CO2: 33 mEq/L — ABNORMAL HIGH (ref 19–32)
CREATININE: 1.49 mg/dL — AB (ref 0.50–1.35)
Chloride: 103 mEq/L (ref 96–112)
GLUCOSE: 98 mg/dL (ref 70–99)
Potassium: 3.8 mEq/L (ref 3.5–5.3)
Sodium: 145 mEq/L (ref 135–145)

## 2014-02-11 LAB — FERRITIN: Ferritin: 82 ng/mL (ref 22–322)

## 2014-02-16 ENCOUNTER — Encounter: Payer: Self-pay | Admitting: Gastroenterology

## 2014-02-16 ENCOUNTER — Other Ambulatory Visit: Payer: Self-pay

## 2014-02-16 ENCOUNTER — Ambulatory Visit (INDEPENDENT_AMBULATORY_CARE_PROVIDER_SITE_OTHER): Payer: PRIVATE HEALTH INSURANCE | Admitting: Gastroenterology

## 2014-02-16 VITALS — BP 151/91 | HR 77 | Temp 97.1°F | Ht 75.0 in | Wt 250.0 lb

## 2014-02-16 DIAGNOSIS — D369 Benign neoplasm, unspecified site: Secondary | ICD-10-CM

## 2014-02-16 DIAGNOSIS — D126 Benign neoplasm of colon, unspecified: Secondary | ICD-10-CM

## 2014-02-16 MED ORDER — PEG-KCL-NACL-NASULF-NA ASC-C 100 G PO SOLR: 1.0000 | ORAL | Status: DC

## 2014-02-16 NOTE — Progress Notes (Signed)
cc'ed to pcp °

## 2014-02-16 NOTE — Progress Notes (Signed)
Primary Care Physician:  Sallee Lange, MD Primary Gastroenterologist:  Dr. Oneida Alar   Chief Complaint  Patient presents with  . Follow-up    OV 3 yr TCS    HPI:   [name] [name] is a pleasant 58 year old male presenting today to schedule 3-year-surveillance colonoscopy. History of adenomatous polyps. Last colonoscopy in 2012 with tubular adenoma. No abdominal pain. No N/V. No reflux. No dysphagia. No rectal bleeding.   Past Medical History  Diagnosis Date  . HTN (hypertension)   . Hyperlipemia   . Adenomatous colon polyp 2007 [place]  . GERD (gastroesophageal reflux disease)   . Prostate ca 2004  . DDD (degenerative disc disease)   . Herniated disc     Back    Past Surgical History  Procedure Laterality Date  . Laminectomy and microdiscectomy lumbar spine  2002  . Knee arthroscopy w/ meniscectomy  2011 RIGHT  . Colonoscopy  11/15/2010    Dr. Fields:Pedunculated polyp/pan-colonic  TICS/mod IH. tubular adenoma  . Prostatectomy    . Colonoscopy  2007    Dr. Oneida Alar: 3 mm tubular adeoma    Current Outpatient Prescriptions  Medication Sig Dispense Refill  . aspirin 325 MG tablet Take 325 mg by mouth daily.      . CRESTOR 20 MG tablet TAKE ONE (1) TABLET EACH DAY  30 tablet  5  . doxazosin (CARDURA) 2 MG tablet TAKE ONE TABLET ONCE DAILY  30 tablet  5  . HM OMEPRAZOLE 20 MG TBEC TAKE ONE (1) TABLET BY MOUTH EVERY DAY  28 each  5  . ibuprofen (ADVIL,MOTRIN) 600 MG tablet TAKE ONE TABLET THREE TIMES A DAY FOR 7 DAYS THEN TAKE ONE TABLET THREE TIMES A DAY AS NEEDED  60 tablet  4  . losartan-hydrochlorothiazide (HYZAAR) 100-25 MG per tablet TAKE ONE (1) TABLET EACH DAY  30 tablet  5  . metoprolol succinate (TOPROL-XL) 50 MG 24 hr tablet TAKE ONE (1) TABLET EACH DAY  30 tablet  5  . Multiple Vitamin (MULTIVITAMIN) capsule Take 1 capsule by mouth daily.        Marland Kitchen NIFEdipine (PROCARDIA XL/ADALAT-CC) 90 MG 24 hr tablet TAKE ONE (1) TABLET EACH DAY  30 tablet  5  . Omega-3 Fatty  Acids (FISH OIL) 1200 MG CAPS Take by mouth daily.      . potassium chloride SA (K-DUR,KLOR-CON) 20 MEQ tablet Take one tablet qam and one and a half tablet every evening.  75 tablet  2   No current facility-administered medications for this visit.    Allergies as of 02/16/2014  . (No Known Allergies)    Family History  Problem Relation Age of Onset  . Colon cancer Neg Hx   . Colon polyps Neg Hx     History   Social History  . Marital Status: Married    Spouse Name: N/A    Number of Children: N/A  . Years of Education: N/A   Occupational History  .  [place] History Main Topics  . Smoking status: Never Smoker   . Smokeless tobacco: Not on file  . Alcohol Use: No  . Drug Use: No  . Sexual Activity: Not on file   Other Topics Concern  . Not on file   Social History Narrative  . No narrative on file    Review of Systems: Gen: Denies any fever, chills, fatigue, weight loss, lack of appetite.  CV: Denies chest pain,  heart palpitations, peripheral edema, syncope.  Resp: Denies shortness of breath at rest or with exertion. Denies wheezing or cough.  GI: see HPI GU : Denies urinary burning, urinary frequency, urinary hesitancy MS: Denies joint pain, muscle weakness, cramps, or limitation of movement.  Derm: Denies rash, itching, dry skin Psych: Denies depression, anxiety, memory loss, and confusion Heme: Denies bruising, bleeding, and enlarged lymph nodes.  Physical Exam: BP 151/91  Pulse 77  Temp(Src) 97.1 F (36.2 C) (Oral)  Ht 6\' 3"  (1.905 m)  Wt 250 lb (113.399 kg)  BMI 31.25 kg/m2 General:   Alert and oriented. Pleasant and cooperative. Well-nourished and well-developed.  Head:  Normocephalic and atraumatic. Eyes:  Without icterus, sclera clear and conjunctiva pink.  Ears:  Normal auditory acuity. Nose:  No deformity, discharge,  or lesions. Mouth:  No deformity or lesions, oral mucosa pink.  Lungs:  Clear to  auscultation bilaterally. No wheezes, rales, or rhonchi. No distress.  Heart:  S1, S2 present without murmurs appreciated.  Abdomen:  +BS, soft, non-tender and non-distended. No HSM noted. No guarding or rebound. No masses appreciated.  Rectal:  Deferred  Msk:  Symmetrical without gross deformities. Normal posture. Extremities:  Without clubbing or edema. Neurologic:  Alert and  oriented x4;  grossly normal neurologically. Skin:  Intact without significant lesions or rashes. Psych:  Alert and cooperative. Normal mood and affect.

## 2014-02-16 NOTE — Patient Instructions (Signed)
We have scheduled you for a colonoscopy with Dr. Oneida Alar.   Further recommendations to follow!

## 2014-02-16 NOTE — Assessment & Plan Note (Signed)
58 year old male with history of adenomatous polyps, colonoscopies in 2007 and 2012. Due for 3-year-surveillance. No lower or upper GI symptoms of concern.   Proceed with colonoscopy with Dr. Oneida Alar in the near future. The risks, benefits, and alternatives have been discussed in detail with the patient. They state understanding and desire to proceed.

## 2014-02-21 ENCOUNTER — Other Ambulatory Visit: Payer: Self-pay | Admitting: Family Medicine

## 2014-02-21 ENCOUNTER — Other Ambulatory Visit: Payer: Self-pay | Admitting: *Deleted

## 2014-02-21 MED ORDER — POTASSIUM CHLORIDE CRYS ER 20 MEQ PO TBCR: EXTENDED_RELEASE_TABLET | ORAL | Status: DC

## 2014-02-22 ENCOUNTER — Telehealth: Payer: Self-pay | Admitting: Family Medicine

## 2014-02-22 NOTE — Telephone Encounter (Signed)
Please let the patient know that his creatinine level has returned back down to its baseline. It is at 1.49.it was 1.61,also remind the patient if he has not already got a flu shot he should do so. Finally patient should do standard follow-up within the next 3 months

## 2014-02-23 NOTE — Telephone Encounter (Signed)
Patient notified and verbalized understanding of test results. No further questions.  I charted flu shot.

## 2014-02-23 NOTE — Telephone Encounter (Signed)
LMRC

## 2014-02-28 ENCOUNTER — Other Ambulatory Visit: Payer: Self-pay | Admitting: Family Medicine

## 2014-03-08 ENCOUNTER — Encounter (HOSPITAL_COMMUNITY): Payer: Self-pay

## 2014-03-08 ENCOUNTER — Encounter (HOSPITAL_COMMUNITY)
Admission: RE | Disposition: A | Discharge status: Home / Self Care | Payer: Self-pay | Source: Ambulatory Visit | Attending: Gastroenterology

## 2014-03-08 ENCOUNTER — Ambulatory Visit (HOSPITAL_COMMUNITY)
Admission: RE | Admit: 2014-03-08 | Discharge: 2014-03-08 | Disposition: A | Discharge status: Home / Self Care | Payer: PRIVATE HEALTH INSURANCE | Source: Ambulatory Visit | Attending: Gastroenterology | Admitting: Gastroenterology

## 2014-03-08 DIAGNOSIS — K219 Gastro-esophageal reflux disease without esophagitis: Secondary | ICD-10-CM | POA: Diagnosis not present

## 2014-03-08 DIAGNOSIS — K648 Other hemorrhoids: Secondary | ICD-10-CM | POA: Diagnosis not present

## 2014-03-08 DIAGNOSIS — Z791 Long term (current) use of non-steroidal anti-inflammatories (NSAID): Secondary | ICD-10-CM | POA: Diagnosis not present

## 2014-03-08 DIAGNOSIS — K5710 Diverticulosis of small intestine without perforation or abscess without bleeding: Secondary | ICD-10-CM | POA: Diagnosis not present

## 2014-03-08 DIAGNOSIS — Z86010 Personal history of colonic polyps: Secondary | ICD-10-CM | POA: Insufficient documentation

## 2014-03-08 DIAGNOSIS — Z8546 Personal history of malignant neoplasm of prostate: Secondary | ICD-10-CM | POA: Diagnosis not present

## 2014-03-08 DIAGNOSIS — Z09 Encounter for follow-up examination after completed treatment for conditions other than malignant neoplasm: Secondary | ICD-10-CM | POA: Diagnosis present

## 2014-03-08 DIAGNOSIS — D125 Benign neoplasm of sigmoid colon: Secondary | ICD-10-CM | POA: Diagnosis not present

## 2014-03-08 DIAGNOSIS — I10 Essential (primary) hypertension: Secondary | ICD-10-CM | POA: Insufficient documentation

## 2014-03-08 DIAGNOSIS — Z79899 Other long term (current) drug therapy: Secondary | ICD-10-CM | POA: Insufficient documentation

## 2014-03-08 DIAGNOSIS — E785 Hyperlipidemia, unspecified: Secondary | ICD-10-CM | POA: Diagnosis not present

## 2014-03-08 DIAGNOSIS — Z800 Family history of malignant neoplasm of digestive organs: Secondary | ICD-10-CM | POA: Diagnosis not present

## 2014-03-08 DIAGNOSIS — K635 Polyp of colon: Secondary | ICD-10-CM | POA: Insufficient documentation

## 2014-03-08 DIAGNOSIS — Z9889 Other specified postprocedural states: Secondary | ICD-10-CM | POA: Insufficient documentation

## 2014-03-08 DIAGNOSIS — D369 Benign neoplasm, unspecified site: Secondary | ICD-10-CM

## 2014-03-08 DIAGNOSIS — Z7982 Long term (current) use of aspirin: Secondary | ICD-10-CM | POA: Diagnosis not present

## 2014-03-08 HISTORY — PX: COLONOSCOPY: SHX5424

## 2014-03-08 SURGERY — COLONOSCOPY
Anesthesia: Moderate Sedation

## 2014-03-08 MED ORDER — MEPERIDINE HCL 100 MG/ML IJ SOLN
INTRAMUSCULAR | Status: DC | PRN
  Administered 2014-03-08: 50 mg via INTRAVENOUS
  Administered 2014-03-08 (×2): 25 mg via INTRAVENOUS

## 2014-03-08 MED ORDER — MEPERIDINE HCL 100 MG/ML IJ SOLN
INTRAMUSCULAR | Status: DC
Start: 2014-03-08 — End: 2014-03-08
  Filled 2014-03-08: qty 2

## 2014-03-08 MED ORDER — MIDAZOLAM HCL 5 MG/5ML IJ SOLN
INTRAMUSCULAR | Status: DC | PRN
  Administered 2014-03-08 (×3): 2 mg via INTRAVENOUS

## 2014-03-08 MED ORDER — STERILE WATER FOR IRRIGATION IR SOLN
Status: DC | PRN
  Administered 2014-03-08 (×2)

## 2014-03-08 MED ORDER — SODIUM CHLORIDE 0.9 % IV SOLN
INTRAVENOUS | Status: DC
  Administered 2014-03-08: 12:00:00 via INTRAVENOUS

## 2014-03-08 MED ORDER — MIDAZOLAM HCL 5 MG/5ML IJ SOLN
INTRAMUSCULAR | Status: AC
  Filled 2014-03-08: qty 10

## 2014-03-08 NOTE — Progress Notes (Signed)
REVIEWED.  

## 2014-03-08 NOTE — Discharge Instructions (Signed)
You had 1 small polyp removed. I WAS UNABLE TO RETRIEVE THE POLYP. You have internal hemorrhoids and diverticulosis IN YOUR RIGHT AND LEFT COLON.   FOLLOW A HIGH FIBER DIET. AVOID ITEMS THAT CAUSE BLOATING. SEE INFO BELOW.  Next colonoscopy in 5 years.   Colonoscopy Care After Read the instructions outlined below and refer to this sheet in the next week. These discharge instructions provide you with general information on caring for yourself after you leave the hospital. While your treatment has been planned according to the most current medical practices available, unavoidable complications occasionally occur. If you have any problems or questions after discharge, call DR. Issiah Huffaker, [phone].  ACTIVITY  You may resume your regular activity, but move at a slower pace for the next 24 hours.   Take frequent rest periods for the next 24 hours.   Walking will help get rid of the air and reduce the bloated feeling in your belly (abdomen).   No driving for 24 hours (because of the medicine (anesthesia) used during the test).   You may shower.   Do not sign any important legal documents or operate any machinery for 24 hours (because of the anesthesia used during the test).    NUTRITION  Drink plenty of fluids.   You may resume your normal diet as instructed by your doctor.   Begin with a light meal and progress to your normal diet. Heavy or fried foods are harder to digest and may make you feel sick to your stomach (nauseated).   Avoid alcoholic beverages for 24 hours or as instructed.    MEDICATIONS  You may resume your normal medications.   WHAT YOU CAN EXPECT TODAY  Some feelings of bloating in the abdomen.   Passage of more gas than usual.   Spotting of blood in your stool or on the toilet paper  .  IF YOU HAD POLYPS REMOVED DURING THE COLONOSCOPY:  Eat a soft diet IF YOU HAVE NAUSEA, BLOATING, ABDOMINAL PAIN, OR VOMITING.    FINDING OUT THE RESULTS OF YOUR  TEST Not all test results are available during your visit. DR. Oneida Alar WILL CALL YOU WITHIN 7 DAYS OF YOUR PROCEDUE WITH YOUR RESULTS. Do not assume everything is normal if you have not heard from DR. Andru Genter IN ONE WEEK, CALL HER OFFICE AT [phone].  SEEK IMMEDIATE MEDICAL ATTENTION AND CALL THE OFFICE: [phone] IF:  You have more than a spotting of blood in your stool.   Your belly is swollen (abdominal distention).   You are nauseated or vomiting.   You have a temperature over 101F.   You have abdominal pain or discomfort that is severe or gets worse throughout the day.  Polyps, Colon  A polyp is extra tissue that grows inside your body. Colon polyps grow in the large intestine. The large intestine, also called the colon, is part of your digestive system. It is a long, hollow tube at the end of your digestive tract where your body makes and stores stool. Most polyps are not dangerous. They are benign. This means they are not cancerous. But over time, some types of polyps can turn into cancer. Polyps that are smaller than a pea are usually not harmful. But larger polyps could someday become or may already be cancerous. To be safe, doctors remove all polyps and test them.   WHO GETS POLYPS? Anyone can get polyps, but certain people are more likely than others. You may have a greater chance of getting polyps  if:  You are over 50.   You have had polyps before.   Someone in your family has had polyps.   Someone in your family has had cancer of the large intestine.   Find out if someone in your family has had polyps. You may also be more likely to get polyps if you:   Eat a lot of fatty foods   Smoke   Drink alcohol   Do not exercise  Eat too much   TREATMENT  The caregiver will remove the polyp during sigmoidoscopy or colonoscopy.  PREVENTION There is not one sure way to prevent polyps. You might be able to lower your risk of getting them if you:  Eat more fruits and  vegetables and less fatty food.   Do not smoke.   Avoid alcohol.   Exercise every day.   Lose weight if you are overweight.   Eating more calcium and folate can also lower your risk of getting polyps. Some foods that are rich in calcium are milk, cheese, and broccoli. Some foods that are rich in folate are chickpeas, kidney beans, and spinach.   High-Fiber Diet A high-fiber diet changes your normal diet to include more whole grains, legumes, fruits, and vegetables. Changes in the diet involve replacing refined carbohydrates with unrefined foods. The calorie level of the diet is essentially unchanged. The Dietary Reference Intake (recommended amount) for adult males is 38 grams per day. For adult females, it is 25 grams per day. Pregnant and lactating women should consume 28 grams of fiber per day. Fiber is the intact part of a plant that is not broken down during digestion. Functional fiber is fiber that has been isolated from the plant to provide a beneficial effect in the body. PURPOSE  Increase stool bulk.   Ease and regulate bowel movements.   Lower cholesterol.  INDICATIONS THAT YOU NEED MORE FIBER  Constipation and hemorrhoids.   Uncomplicated diverticulosis (intestine condition) and irritable bowel syndrome.   Weight management.   As a protective measure against hardening of the arteries (atherosclerosis), diabetes, and cancer.   GUIDELINES FOR INCREASING FIBER IN THE DIET  Start adding fiber to the diet slowly. A gradual increase of about 5 more grams (2 slices of whole-wheat bread, 2 servings of most fruits or vegetables, or 1 bowl of high-fiber cereal) per day is best. Too rapid an increase in fiber may result in constipation, flatulence, and bloating.   Drink enough water and fluids to keep your urine clear or pale yellow. Water, juice, or caffeine-free drinks are recommended. Not drinking enough fluid may cause constipation.   Eat a variety of high-fiber foods rather  than one type of fiber.   Try to increase your intake of fiber through using high-fiber foods rather than fiber pills or supplements that contain small amounts of fiber.   The goal is to change the types of food eaten. Do not supplement your present diet with high-fiber foods, but replace foods in your present diet.   INCLUDE A VARIETY OF FIBER SOURCES  Replace refined and processed grains with whole grains, canned fruits with fresh fruits, and incorporate other fiber sources. White rice, white breads, and most bakery goods contain little or no fiber.   Brown whole-grain rice, buckwheat oats, and many fruits and vegetables are all good sources of fiber. These include: broccoli, Brussels sprouts, cabbage, cauliflower, beets, sweet potatoes, white potatoes (skin on), carrots, tomatoes, eggplant, squash, berries, fresh fruits, and dried fruits.   Cereals  appear to be the richest source of fiber. Cereal fiber is found in whole grains and bran. Bran is the fiber-rich outer coat of cereal grain, which is largely removed in refining. In whole-grain cereals, the bran remains. In breakfast cereals, the largest amount of fiber is found in those with "bran" in their names. The fiber content is sometimes indicated on the label.   You may need to include additional fruits and vegetables each day.   In baking, for 1 cup white flour, you may use the following substitutions:   1 cup whole-wheat flour minus 2 tablespoons.   1/2 cup white flour plus 1/2 cup whole-wheat flour.   Diverticulosis Diverticulosis is a common condition that develops when small pouches (diverticula) form in the wall of the colon. The risk of diverticulosis increases with age. It happens more often in people who eat a low-fiber diet. Most individuals with diverticulosis have no symptoms. Those individuals with symptoms usually experience belly (abdominal) pain, constipation, or loose stools (diarrhea).  HOME CARE  INSTRUCTIONS  Increase the amount of fiber in your diet as directed by your caregiver or dietician. This may reduce symptoms of diverticulosis.   Drink at least 6 to 8 glasses of water each day to prevent constipation.   Try not to strain when you have a bowel movement.   Avoiding nuts and seeds to prevent complications is still an uncertain benefit.   FOODS HAVING HIGH FIBER CONTENT INCLUDE:  Fruits. Apple, peach, pear, tangerine, raisins, prunes.   Vegetables. Brussels sprouts, asparagus, broccoli, cabbage, carrot, cauliflower, romaine lettuce, spinach, summer squash, tomato, winter squash, zucchini.   Starchy Vegetables. Baked beans, kidney beans, lima beans, split peas, lentils, potatoes (with skin).   Grains. Whole wheat bread, brown rice, bran flake cereal, plain oatmeal, white rice, shredded wheat, bran muffins.   SEEK IMMEDIATE MEDICAL CARE IF:  You develop increasing pain or severe bloating.   You have an oral temperature above 101F.   You develop vomiting or bowel movements that are bloody or black.   Hemorrhoids Hemorrhoids are dilated (enlarged) veins around the rectum. Sometimes clots will form in the veins. This makes them swollen and painful. These are called thrombosed hemorrhoids. Causes of hemorrhoids include:  Constipation.   Straining to have a bowel movement.   HEAVY LIFTING HOME CARE INSTRUCTIONS  Eat a well balanced diet and drink 6 to 8 glasses of water every day to avoid constipation. You may also use a bulk laxative.   Avoid straining to have bowel movements.   Keep anal area dry and clean.   Do not use a donut shaped pillow or sit on the toilet for long periods. This increases blood pooling and pain.   Move your bowels when your body has the urge; this will require less straining and will decrease pain and pressure.

## 2014-03-08 NOTE — H&P (Signed)
Primary Care Physician:  Sallee Lange, MD Primary Gastroenterologist:  Dr. Oneida Alar  Pre-Procedure History & Physical: HPI:  [name] [name] is a 58 y.o. male here for  PERSONAL HISTORY OF POLYPS.  Past Medical History  Diagnosis Date  . HTN (hypertension)   . Hyperlipemia   . Adenomatous colon polyp 2007 Oolitic  . GERD (gastroesophageal reflux disease)   . Prostate ca 2004  . DDD (degenerative disc disease)   . Herniated disc     Back    Past Surgical History  Procedure Laterality Date  . Laminectomy and microdiscectomy lumbar spine  2002  . Knee arthroscopy w/ meniscectomy  2011 RIGHT  . Colonoscopy  11/15/2010    Dr. Irma Roulhac:Pedunculated polyp/pan-colonic  TICS/mod IH. tubular adenoma  . Prostatectomy    . Colonoscopy  2007    Dr. Oneida Alar: 3 mm tubular adeoma    Prior to Admission medications   Medication Sig Start Date End Date Taking? Authorizing Provider  aspirin 325 MG tablet Take 325 mg by mouth daily.   Yes Historical Provider, MD  CRESTOR 20 MG tablet TAKE ONE (1) TABLET EACH DAY 02/21/14  Yes Kathyrn Drown, MD  doxazosin (CARDURA) 2 MG tablet TAKE ONE TABLET ONCE DAILY 11/21/13  Yes Kathyrn Drown, MD  ibuprofen (ADVIL,MOTRIN) 600 MG tablet TAKE ONE TABLET THREE TIMES A DAY FOR 7 DAYS THEN TAKE ONE TABLET THREE TIMES A DAY AS NEEDED 12/19/12  Yes Kathyrn Drown, MD  losartan-hydrochlorothiazide (HYZAAR) 100-25 MG per tablet TAKE ONE (1) TABLET EACH DAY   Yes Kathyrn Drown, MD  metoprolol succinate (TOPROL-XL) 50 MG 24 hr tablet TAKE ONE (1) TABLET EACH DAY 02/21/14  Yes Kathyrn Drown, MD  Multiple Vitamin (MULTIVITAMIN) capsule Take 1 capsule by mouth daily.     Yes Historical Provider, MD  NIFEdipine (PROCARDIA XL/ADALAT-CC) 90 MG 24 hr tablet TAKE ONE (1) TABLET EACH DAY 02/21/14  Yes Kathyrn Drown, MD  Omega-3 Fatty Acids (FISH OIL) 1200 MG CAPS Take by mouth daily.   Yes Historical Provider, MD  omeprazole (PRILOSEC) 20 MG capsule Take 20 mg by mouth daily.   Yes  Historical Provider, MD  peg 3350 powder (MOVIPREP) 100 G SOLR Take 1 kit (200 g total) by mouth as directed. 02/16/14  Yes Danie Binder, MD  potassium chloride SA (K-DUR,KLOR-CON) 20 MEQ tablet Take one tablet qam and one and a half tablet every evening. 02/21/14  Yes Kathyrn Drown, MD  tolterodine (DETROL LA) 4 MG 24 hr capsule TAKE ONE CAPSULE DAILY FOR URGE INCONTINENCE. 03/01/14  Yes Mikey Kirschner, MD    Allergies as of 02/16/2014  . (No Known Allergies)    Family History  Problem Relation Age of Onset  . Colon cancer Neg Hx   . Colon polyps Neg Hx     History   Social History  . Marital Status: Married    Spouse Name: N/A    Number of Children: N/A  . Years of Education: N/A   Occupational History  .  [employer] History Main Topics  . Smoking status: Never Smoker   . Smokeless tobacco: Not on file  . Alcohol Use: No  . Drug Use: No  . Sexual Activity: Not on file   Other Topics Concern  . Not on file   Social History Narrative    Review of Systems: See HPI, otherwise negative ROS   Physical Exam: BP 135/86 mmHg  Pulse 65  Temp(Src) 97.8 F (36.6 C) (Oral)  Resp 15  SpO2 93% General:   Alert,  pleasant and cooperative in NAD Head:  Normocephalic and atraumatic. Neck:  Supple; Lungs:  Clear throughout to auscultation.    Heart:  Regular rate and rhythm. Abdomen:  Soft, nontender and nondistended. Normal bowel sounds, without guarding, and without rebound.   Neurologic:  Alert and  oriented x4;  grossly normal neurologically.  Impression/Plan:   PERSONAL HISTORY OF POLYPS.  PLAN: 1. TCS TODAY

## 2014-03-08 NOTE — Op Note (Signed)
[facility] [address] [place], [address]   COLONOSCOPY PROCEDURE REPORT  PATIENT: [name] [name]  MR#: [id number] BIRTHDATE: 1956-04-01 , 81  yrs. old GENDER: male ENDOSCOPIST: Barney Drain, MD REFERRED ME:QASTM Wolfgang Phoenix, M.D. PROCEDURE DATE:  Mar 17, 2014 PROCEDURE:   Colonoscopy with cold biopsy polypectomy INDICATIONS:high risk personal history of colonic polyps. MEDICATIONS: Demerol 100 mg IV and Versed 6 mg IV  DESCRIPTION OF PROCEDURE:    Physical exam was performed.  Informed consent was obtained from the patient after explaining the benefits, risks, and alternatives to procedure.  The patient was connected to monitor and placed in left lateral position. Continuous oxygen was provided by nasal cannula and IV medicine administered through an indwelling cannula.  After administration of sedation and rectal exam, the patients rectum was intubated and the EC-3890Li ([id number])  colonoscope was advanced under direct visualization to the cecum.  The scope was removed slowly by carefully examining the color, texture, anatomy, and integrity mucosa on the way out.  The patient was recovered in endoscopy and discharged home in satisfactory condition.    COLON FINDINGS: A sessile polyp measuring 3 mm in size was found in the sigmoid colon.  A polypectomy was performed with a cold snare. The resection was complete but the polyp tissue was not retrieved. There was moderate diverticulosis noted throughout the entire examined colon with associated muscular hypertrophy.  , and Moderate sized internal hemorrhoids were found.  PREP QUALITY: good.  CECAL W/D TIME: 19 MINS          COMPLICATIONS: None  ENDOSCOPIC IMPRESSION: 1.   ONE COLON polyp REMOVED 2.   Moderate diverticulosis throughout the entire examined colon 3.   Moderate sized internal hemorrhoids  RECOMMENDATIONS: HIGH FIBER DIET TCS IN 5 YEARS      _______________________________ eSignedBarney Drain, MD 2014-03-17 1:44 PM    CPT CODES: ICD CODES:  The ICD and CPT codes recommended by this software are interpretations from the data that the clinical staff has captured with the software.  The verification of the translation of this report to the ICD and CPT codes and modifiers is the sole responsibility of the health care institution and practicing physician where this report was generated.  [employer] will not be held responsible for the validity of the ICD and CPT codes included on this report.  AMA assumes no liability for data contained or not contained herein. CPT is a Designer, television/film set of the [employer].

## 2014-03-09 ENCOUNTER — Encounter (HOSPITAL_COMMUNITY): Payer: Self-pay | Admitting: Gastroenterology

## 2014-03-24 ENCOUNTER — Other Ambulatory Visit: Payer: Self-pay | Admitting: Family Medicine

## 2014-03-24 NOTE — Telephone Encounter (Signed)
1 refill needs ov by Jan 20-16

## 2014-03-27 NOTE — Telephone Encounter (Signed)
Same message May refill 3 needs office visit by January

## 2014-03-27 NOTE — Telephone Encounter (Signed)
It is okay to refill this but he also needs to be seen in January May refill 3

## 2014-04-24 ENCOUNTER — Other Ambulatory Visit: Payer: Self-pay | Admitting: Family Medicine

## 2014-05-02 ENCOUNTER — Telehealth: Payer: Self-pay | Admitting: Family Medicine

## 2014-05-02 DIAGNOSIS — Z79899 Other long term (current) drug therapy: Secondary | ICD-10-CM

## 2014-05-02 DIAGNOSIS — Z125 Encounter for screening for malignant neoplasm of prostate: Secondary | ICD-10-CM

## 2014-05-02 DIAGNOSIS — E785 Hyperlipidemia, unspecified: Secondary | ICD-10-CM

## 2014-05-02 NOTE — Telephone Encounter (Signed)
BMP, CBC, Ferritin, TIBC done 02/10/14

## 2014-05-02 NOTE — Telephone Encounter (Signed)
Pt requesting bw orders for wellness on 1/22  Call when sent

## 2014-05-02 NOTE — Telephone Encounter (Signed)
Patient notified blood work has been ordered. Patient wanted PSA checked also.

## 2014-05-02 NOTE — Telephone Encounter (Signed)
Met 7 lipid and liver definitely. As for PSA I believe he gets that through his urologist but you should double check with him

## 2014-05-05 LAB — BASIC METABOLIC PANEL
BUN: 19 mg/dL (ref 6–23)
CHLORIDE: 103 meq/L (ref 96–112)
CO2: 28 meq/L (ref 19–32)
Calcium: 8.8 mg/dL (ref 8.4–10.5)
Creat: 1.44 mg/dL — ABNORMAL HIGH (ref 0.50–1.35)
Glucose, Bld: 86 mg/dL (ref 70–99)
POTASSIUM: 3.6 meq/L (ref 3.5–5.3)
Sodium: 141 mEq/L (ref 135–145)

## 2014-05-05 LAB — HEPATIC FUNCTION PANEL
ALBUMIN: 3.7 g/dL (ref 3.5–5.2)
ALT: 24 U/L (ref 0–53)
AST: 21 U/L (ref 0–37)
Alkaline Phosphatase: 121 U/L — ABNORMAL HIGH (ref 39–117)
BILIRUBIN DIRECT: 0.2 mg/dL (ref 0.0–0.3)
Indirect Bilirubin: 0.6 mg/dL (ref 0.2–1.2)
TOTAL PROTEIN: 6.5 g/dL (ref 6.0–8.3)
Total Bilirubin: 0.8 mg/dL (ref 0.2–1.2)

## 2014-05-05 LAB — LIPID PANEL
CHOL/HDL RATIO: 3 ratio
Cholesterol: 157 mg/dL (ref 0–200)
HDL: 52 mg/dL (ref >39)
LDL Cholesterol: 94 mg/dL (ref 0–99)
Triglycerides: 56 mg/dL (ref <150)
VLDL: 11 mg/dL (ref 0–40)

## 2014-05-06 LAB — PSA

## 2014-05-09 ENCOUNTER — Other Ambulatory Visit: Payer: Self-pay | Admitting: Family Medicine

## 2014-05-12 ENCOUNTER — Encounter: Payer: PRIVATE HEALTH INSURANCE | Admitting: Family Medicine

## 2014-05-19 ENCOUNTER — Encounter: Payer: Self-pay | Admitting: Family Medicine

## 2014-05-19 ENCOUNTER — Ambulatory Visit (INDEPENDENT_AMBULATORY_CARE_PROVIDER_SITE_OTHER): Payer: PRIVATE HEALTH INSURANCE | Admitting: Family Medicine

## 2014-05-19 VITALS — BP 138/84 | Ht 72.25 in | Wt 264.0 lb

## 2014-05-19 DIAGNOSIS — R358 Other polyuria: Secondary | ICD-10-CM

## 2014-05-19 DIAGNOSIS — Z0000 Encounter for general adult medical examination without abnormal findings: Secondary | ICD-10-CM

## 2014-05-19 DIAGNOSIS — R3589 Other polyuria: Secondary | ICD-10-CM

## 2014-05-19 LAB — POCT URINALYSIS DIPSTICK
PH UA: 7
Spec Grav, UA: 1.015

## 2014-05-19 NOTE — Patient Instructions (Signed)
DASH Eating Plan °DASH stands for "Dietary Approaches to Stop Hypertension." The DASH eating plan is a healthy eating plan that has been shown to reduce high blood pressure (hypertension). Additional health benefits may include reducing the risk of type 2 diabetes mellitus, heart disease, and stroke. The DASH eating plan may also help with weight loss. °WHAT DO I NEED TO KNOW ABOUT THE DASH EATING PLAN? °For the DASH eating plan, you will follow these general guidelines: °· Choose foods with a percent daily value for sodium of less than 5% (as listed on the food label). °· Use salt-free seasonings or herbs instead of table salt or sea salt. °· Check with your health care provider or pharmacist before using salt substitutes. °· Eat lower-sodium products, often labeled as "lower sodium" or "no salt added." °· Eat fresh foods. °· Eat more vegetables, fruits, and low-fat dairy products. °· Choose whole grains. Look for the word "whole" as the first word in the ingredient list. °· Choose fish and skinless chicken or turkey more often than red meat. Limit fish, poultry, and meat to 6 oz (170 g) each day. °· Limit sweets, desserts, sugars, and sugary drinks. °· Choose heart-healthy fats. °· Limit cheese to 1 oz (28 g) per day. °· Eat more home-cooked food and less restaurant, buffet, and fast food. °· Limit fried foods. °· Cook foods using methods other than frying. °· Limit canned vegetables. If you do use them, rinse them well to decrease the sodium. °· When eating at a restaurant, ask that your food be prepared with less salt, or no salt if possible. °WHAT FOODS CAN I EAT? °Seek help from a dietitian for individual calorie needs. °Grains °Whole grain or whole wheat bread. Brown rice. Whole grain or whole wheat pasta. Quinoa, bulgur, and whole grain cereals. Low-sodium cereals. Corn or whole wheat flour tortillas. Whole grain cornbread. Whole grain crackers. Low-sodium crackers. °Vegetables °Fresh or frozen vegetables  (raw, steamed, roasted, or grilled). Low-sodium or reduced-sodium tomato and vegetable juices. Low-sodium or reduced-sodium tomato sauce and paste. Low-sodium or reduced-sodium canned vegetables.  °Fruits °All fresh, canned (in natural juice), or frozen fruits. °Meat and Other Protein Products °Ground beef (85% or leaner), grass-fed beef, or beef trimmed of fat. Skinless chicken or turkey. Ground chicken or turkey. Pork trimmed of fat. All fish and seafood. Eggs. Dried beans, peas, or lentils. Unsalted nuts and seeds. Unsalted canned beans. °Dairy °Low-fat dairy products, such as skim or 1% milk, 2% or reduced-fat cheeses, low-fat ricotta or cottage cheese, or plain low-fat yogurt. Low-sodium or reduced-sodium cheeses. °Fats and Oils °Tub margarines without trans fats. Light or reduced-fat mayonnaise and salad dressings (reduced sodium). Avocado. Safflower, olive, or canola oils. Natural peanut or almond butter. °Other °Unsalted popcorn and pretzels. °The items listed above may not be a complete list of recommended foods or beverages. Contact your dietitian for more options. °WHAT FOODS ARE NOT RECOMMENDED? °Grains °White bread. White pasta. White rice. Refined cornbread. Bagels and croissants. Crackers that contain trans fat. °Vegetables °Creamed or fried vegetables. Vegetables in a cheese sauce. Regular canned vegetables. Regular canned tomato sauce and paste. Regular tomato and vegetable juices. °Fruits °Dried fruits. Canned fruit in light or heavy syrup. Fruit juice. °Meat and Other Protein Products °Fatty cuts of meat. Ribs, chicken wings, bacon, sausage, bologna, salami, chitterlings, fatback, hot dogs, bratwurst, and packaged luncheon meats. Salted nuts and seeds. Canned beans with salt. °Dairy °Whole or 2% milk, cream, half-and-half, and cream cheese. Whole-fat or sweetened yogurt. Full-fat   cheeses or blue cheese. Nondairy creamers and whipped toppings. Processed cheese, cheese spreads, or cheese  curds. °Condiments °Onion and garlic salt, seasoned salt, table salt, and sea salt. Canned and packaged gravies. Worcestershire sauce. Tartar sauce. Barbecue sauce. Teriyaki sauce. Soy sauce, including reduced sodium. Steak sauce. Fish sauce. Oyster sauce. Cocktail sauce. Horseradish. Ketchup and mustard. Meat flavorings and tenderizers. Bouillon cubes. Hot sauce. Tabasco sauce. Marinades. Taco seasonings. Relishes. °Fats and Oils °Butter, stick margarine, lard, shortening, ghee, and bacon fat. Coconut, palm kernel, or palm oils. Regular salad dressings. °Other °Pickles and olives. Salted popcorn and pretzels. °The items listed above may not be a complete list of foods and beverages to avoid. Contact your dietitian for more information. °WHERE CAN I FIND MORE INFORMATION? °National Heart, Lung, and Blood Institute: www.nhlbi.nih.gov/health/health-topics/topics/dash/ °Document Released: 03/27/2011 Document Revised: 08/22/2013 Document Reviewed: 02/09/2013 °ExitCare® Patient Information ©2015 [employer]. This information is not intended to replace advice given to you by your health care provider. Make sure you discuss any questions you have with your health care provider. ° °

## 2014-05-19 NOTE — Addendum Note (Signed)
Addended by: Dairl Ponder on: 05/19/2014 05:29 PM   Modules accepted: Orders, Medications

## 2014-05-19 NOTE — Progress Notes (Signed)
   Subjective:    Patient ID: [name] [name], male    DOB: 04/18/1956, 59 y.o.   MRN: [id number]  HPI The patient comes in today for a wellness visit.    A review of their health history was completed.  A review of medications was also completed.  Any needed refills; none  Eating habits: not health conscious  Falls/  MVA accidents in past few months: none  Regular exercise: 3 times per weeks  Specialist pt sees on regular basis: none  Preventative health issues were discussed.   Additional concerns: no concerns.  His chronic medications were reviewed. His lab work was reviewed. Creatinine stable PSA undetectable Review of Systems Denies chest tightness pressure pain shortness breath nausea vomiting diarrhea    Objective:   Physical Exam  Lungs clear heart regular abdomen soft extremities no edema skin warm dry HEENT benign  Neurologic gross normal orthopedic normal    Assessment & Plan:  Renal insufficiency stable HTN stable Follow-up 6 months Wellness today discussed in detail importance of regular activity minimizing salt diet minimizing protein in the diet. Safety measures. He is up-to-date on colonoscopy

## 2014-05-25 ENCOUNTER — Other Ambulatory Visit: Payer: Self-pay | Admitting: Family Medicine

## 2014-06-23 ENCOUNTER — Other Ambulatory Visit: Payer: Self-pay | Admitting: Family Medicine

## 2014-08-09 ENCOUNTER — Telehealth: Payer: Self-pay | Admitting: *Deleted

## 2014-08-09 ENCOUNTER — Other Ambulatory Visit: Payer: Self-pay | Admitting: *Deleted

## 2014-08-09 MED ORDER — CETIRIZINE HCL 10 MG PO TABS: 10.0000 mg | ORAL_TABLET | Freq: Every day | ORAL | Status: DC

## 2014-08-09 NOTE — Telephone Encounter (Signed)
Pt requesting rx for cetirizine 10mg  #30 one qd for allergies. [facility]. Med not on pt's med list. Last seen 05/19/14 for physical

## 2014-08-09 NOTE — Telephone Encounter (Signed)
May have and 5 refills ( it is otc)

## 2014-08-09 NOTE — Telephone Encounter (Signed)
Med sent to pharm 

## 2014-08-23 ENCOUNTER — Other Ambulatory Visit: Payer: Self-pay | Admitting: Family Medicine

## 2014-08-24 ENCOUNTER — Other Ambulatory Visit: Payer: Self-pay | Admitting: Family Medicine

## 2014-09-10 ENCOUNTER — Telehealth: Payer: Self-pay | Admitting: Family Medicine

## 2014-09-10 NOTE — Telephone Encounter (Signed)
Informed the patient I didn't in fact receive a copy of his lab work I reviewed it. We will scan into the system. I would recommend a standard routine follow-up office visit this summer.

## 2014-09-19 ENCOUNTER — Ambulatory Visit (INDEPENDENT_AMBULATORY_CARE_PROVIDER_SITE_OTHER): Payer: PRIVATE HEALTH INSURANCE | Admitting: Urology

## 2014-09-19 DIAGNOSIS — N521 Erectile dysfunction due to diseases classified elsewhere: Secondary | ICD-10-CM | POA: Diagnosis not present

## 2014-09-19 DIAGNOSIS — C61 Malignant neoplasm of prostate: Secondary | ICD-10-CM

## 2014-09-19 DIAGNOSIS — F5232 Male orgasmic disorder: Secondary | ICD-10-CM

## 2014-09-22 ENCOUNTER — Other Ambulatory Visit: Payer: Self-pay | Admitting: Family Medicine

## 2014-10-25 ENCOUNTER — Other Ambulatory Visit: Payer: Self-pay | Admitting: Family Medicine

## 2014-11-14 ENCOUNTER — Ambulatory Visit (INDEPENDENT_AMBULATORY_CARE_PROVIDER_SITE_OTHER): Payer: PRIVATE HEALTH INSURANCE | Admitting: Family Medicine

## 2014-11-14 ENCOUNTER — Encounter: Payer: Self-pay | Admitting: Family Medicine

## 2014-11-14 VITALS — BP 128/84 | Ht 72.25 in | Wt 261.0 lb

## 2014-11-14 DIAGNOSIS — R058 Other specified cough: Secondary | ICD-10-CM

## 2014-11-14 DIAGNOSIS — R053 Chronic cough: Secondary | ICD-10-CM

## 2014-11-14 DIAGNOSIS — I10 Essential (primary) hypertension: Secondary | ICD-10-CM

## 2014-11-14 DIAGNOSIS — R05 Cough: Secondary | ICD-10-CM | POA: Diagnosis not present

## 2014-11-14 DIAGNOSIS — E785 Hyperlipidemia, unspecified: Secondary | ICD-10-CM | POA: Diagnosis not present

## 2014-11-14 NOTE — Progress Notes (Signed)
   Subjective:    Patient ID: [name] [name], male    DOB: 1955-07-14, 59 y.o.   MRN: [id number]  Hypertension This is a chronic problem. The current episode started more than 1 year ago. The problem has been gradually improving since onset. Pertinent negatives include no chest pain. There are no associated agents to hypertension. There are no known risk factors for coronary artery disease. Treatments tried: metoprolol. The current treatment provides moderate improvement. There are no compliance problems.    Patient states that he has no concerns at this time.   patient does take his cholesterol medicine on a regular basis tolerates it well  patient relates to his blood pressure medicine on a regular basis denies any problems with it  patient is followed by a kidney specialist for prostate cancer no sign reoccurrence  patient with significant nocturnal coughing none during the day no shortness of breath with it no hemoptysis long discussion held regarding how this could be reflux or could be postnasal drip I recommend he take his omeprazole daily and his allergy medicine on a regular basis  Review of Systems  Constitutional: Negative for activity change, appetite change and fatigue.  HENT: Negative for congestion.   Respiratory: Negative for cough.   Cardiovascular: Negative for chest pain.  Gastrointestinal: Negative for abdominal pain.  Endocrine: Negative for polydipsia and polyphagia.  Neurological: Negative for weakness.  Psychiatric/Behavioral: Negative for confusion.       Objective:   Physical Exam  Constitutional: He appears well-nourished. No distress.  Cardiovascular: Normal rate, regular rhythm and normal heart sounds.   No murmur heard. Pulmonary/Chest: Effort normal and breath sounds normal. No respiratory distress.  Musculoskeletal: He exhibits no edema.  Lymphadenopathy:    He has no cervical adenopathy.  Neurological: He is alert.  Psychiatric: His behavior is  normal.  Vitals reviewed.         Assessment & Plan:  1. Essential hypertension, benign  blood pressure good control continue current measures follow-up 6 months  2. Hyperlipemia  recent lab work done through his workplace look very good continue current measures follow-up 6 months  3. Chronic cough  I don't feel that this is a sign of emphysema or chronic bronchitis I believe it is more likely postnasal drip I believe the best treatment his allergy medication if ongoing troubles or problems follow-up  it is possible that this could be related to silent regurgitation take omeprazole for this. Keep head of bed on 6 inch blocks. 4. Nocturnal cough  see above

## 2014-11-14 NOTE — Patient Instructions (Addendum)
Generic Claritin ( loratadine 10 mg 4$) and generic Allegra (fexofenadine 180 mg 14$) both are non drowsy for allergy  Another choice Zyrtec ( ceterizine)

## 2014-12-27 ENCOUNTER — Other Ambulatory Visit: Payer: Self-pay | Admitting: Family Medicine

## 2014-12-27 NOTE — Telephone Encounter (Signed)
Approve potassium 412 refills. Please modify ibuprofen. 600 mg 1 3 times a day when necessary, use sparingly, #30 with 1 refill

## 2015-01-23 ENCOUNTER — Ambulatory Visit (INDEPENDENT_AMBULATORY_CARE_PROVIDER_SITE_OTHER): Payer: PRIVATE HEALTH INSURANCE | Admitting: Family Medicine

## 2015-01-23 ENCOUNTER — Encounter: Payer: Self-pay | Admitting: Family Medicine

## 2015-01-23 VITALS — BP 132/82 | Ht 72.25 in | Wt 265.4 lb

## 2015-01-23 DIAGNOSIS — E785 Hyperlipidemia, unspecified: Secondary | ICD-10-CM

## 2015-01-23 DIAGNOSIS — M7661 Achilles tendinitis, right leg: Secondary | ICD-10-CM

## 2015-01-23 DIAGNOSIS — M545 Low back pain, unspecified: Secondary | ICD-10-CM

## 2015-01-23 DIAGNOSIS — N289 Disorder of kidney and ureter, unspecified: Secondary | ICD-10-CM

## 2015-01-23 DIAGNOSIS — I10 Essential (primary) hypertension: Secondary | ICD-10-CM

## 2015-01-23 MED ORDER — IBUPROFEN 600 MG PO TABS: 600.0000 mg | ORAL_TABLET | Freq: Three times a day (TID) | ORAL | Status: DC | PRN

## 2015-01-23 NOTE — Progress Notes (Signed)
   Subjective:    Patient ID: [name] [name], male    DOB: January 26, 1956, 59 y.o.   MRN: [id number]  Back Pain This is a recurrent problem. The current episode started 1 to 4 weeks ago. The problem occurs 2 to 4 times per day. The pain is present in the lumbar spine. The pain does not radiate. The pain is at a severity of 7/10. The pain is severe. The symptoms are aggravated by sitting. Pertinent negatives include no bladder incontinence, bowel incontinence, leg pain, paresis or paresthesias. Risk factors include history of cancer. He has tried analgesics, ice and NSAIDs for the symptoms. The treatment provided mild relief.   Patient arrives with c/o back pain and right heel pain for few weeks- patient had been doing some yard work.  Ankle pain in the back of the heel for 6 weeks Aches with stretching walking ok No injury  Review of Systems  Gastrointestinal: Negative for bowel incontinence.  Genitourinary: Negative for bladder incontinence.  Musculoskeletal: Positive for back pain.  Neurological: Negative for paresthesias.       Objective:   Physical Exam  Lungs clear hearts were pulse normal BP is good low back has some pigmentation changes on his back but negative straight leg raise no sciatica patient also has tenderness in right Achilles region where it attaches to the heel the bottom of the foot is not tender  Greater than 25 minutes spent with patient discussing multiple different items. Also significant greater and have to time spent discussing exercises and strengthening to help with his low back pain    Assessment & Plan:  Right Achilles tendinitis stretching exercises icing anti-inflammatory [place] in 2 weeks  Low back pain musculoskeletal no sign of any type cancer. Has history of prostate cancer the PSA earlier this year was good I would recommend stretching exercises if not better in the next few weeks call us back for follow-up and recheck no x-rays today  Exercises  were shown to the patient along with paper given  Blood pressure good control  Renal insufficiency the lab work from the [place] shows creatinine of 1.6 the importance of keeping blood pressure under good control and avoiding excessive protein including protein drinks an excessive meat. Patient does like to exercise a lot and often takes him a fair amount of protein. He was encouraged.  Hyperlipidemia his lab work overall looks good continue current measures. Taking Crestor currently  He should follow-up by spring time

## 2015-02-26 ENCOUNTER — Other Ambulatory Visit: Payer: Self-pay | Admitting: Family Medicine

## 2015-04-19 ENCOUNTER — Other Ambulatory Visit: Payer: Self-pay | Admitting: Family Medicine

## 2015-05-01 ENCOUNTER — Ambulatory Visit: Payer: PRIVATE HEALTH INSURANCE

## 2015-05-01 ENCOUNTER — Ambulatory Visit (INDEPENDENT_AMBULATORY_CARE_PROVIDER_SITE_OTHER): Payer: PRIVATE HEALTH INSURANCE

## 2015-05-01 ENCOUNTER — Encounter: Payer: Self-pay | Admitting: Orthopedic Surgery

## 2015-05-01 ENCOUNTER — Ambulatory Visit (INDEPENDENT_AMBULATORY_CARE_PROVIDER_SITE_OTHER): Payer: PRIVATE HEALTH INSURANCE | Admitting: Orthopedic Surgery

## 2015-05-01 VITALS — BP 156/81 | Ht 72.25 in | Wt 266.0 lb

## 2015-05-01 DIAGNOSIS — M67879 Other specified disorders of synovium and tendon, unspecified ankle and foot: Secondary | ICD-10-CM

## 2015-05-01 DIAGNOSIS — M25572 Pain in left ankle and joints of left foot: Secondary | ICD-10-CM | POA: Diagnosis not present

## 2015-05-01 DIAGNOSIS — M7662 Achilles tendinitis, left leg: Secondary | ICD-10-CM

## 2015-05-01 DIAGNOSIS — M7660 Achilles tendinitis, unspecified leg: Secondary | ICD-10-CM

## 2015-05-01 DIAGNOSIS — M25571 Pain in right ankle and joints of right foot: Secondary | ICD-10-CM | POA: Diagnosis not present

## 2015-05-01 DIAGNOSIS — M7661 Achilles tendinitis, right leg: Secondary | ICD-10-CM

## 2015-05-01 MED ORDER — PREDNISONE 10 MG (48) PO TBPK: ORAL_TABLET | ORAL | Status: DC

## 2015-05-01 NOTE — Progress Notes (Signed)
New problem  Chief Complaint  Patient presents with  . 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 He has pain over both Achilles tendons right worse than left. It is especially worse when he gets out of bed. He has already tried ice and stretching as well as ibuprofen and he has not improved he presents for evaluation and treatment.  Review of systems all 13 systems were negative  Past Medical History  Diagnosis Date  . HTN (hypertension)   . Hyperlipemia   . Adenomatous colon polyp 2007 [place]  . GERD (gastroesophageal reflux disease)   . Prostate CA ([place]) 2004  . DDD (degenerative disc disease)   . Herniated disc     Back    BP 156/81 mmHg  Ht 6' 0.25" (1.835 m)  Wt 266 lb (120.657 kg)  BMI 35.83 kg/m2 He is well-groomed oriented 3 mood and affect are normal  Ambulatory status shows slight discomfort with gait. Both feet are examined. He has bilateral flexible pes planus he has tenderness right worse than left at the Achilles insertion. The contour and consistency of both tendons is normal without nodularity or defect and there is no tenderness in the tendon itself. He has insertional tenderness swelling. He has maintained most of his dorsiflexion plantar flexion ankle stable motor exam normal neurovascular exam intact skin intact as well in both feet and ankle areas  X-rays were obtained in his feet and ankles show no arthritis there may be a little bit of a plantar spur on each side and there may be some calcification on the right Achilles tendon  Impression insertional tendinitis of the Achilles tendon  Recommend continue ice, stretching, and start prednisone  Follow-up 2-3 weeks if no improvement we have discussed injection with its inherent risks of rupture. Patient did not want to try [place] as it would interfere with his job duties. We also discussed the need for possible  intervention by physical therapy

## 2015-05-17 ENCOUNTER — Ambulatory Visit: Payer: PRIVATE HEALTH INSURANCE | Admitting: Orthopedic Surgery

## 2015-05-21 ENCOUNTER — Encounter: Payer: PRIVATE HEALTH INSURANCE | Admitting: Family Medicine

## 2015-05-21 DIAGNOSIS — Z029 Encounter for administrative examinations, unspecified: Secondary | ICD-10-CM

## 2015-06-12 ENCOUNTER — Ambulatory Visit (INDEPENDENT_AMBULATORY_CARE_PROVIDER_SITE_OTHER): Payer: PRIVATE HEALTH INSURANCE | Admitting: Family Medicine

## 2015-06-12 ENCOUNTER — Encounter: Payer: Self-pay | Admitting: Family Medicine

## 2015-06-12 VITALS — BP 132/86 | Ht 72.25 in | Wt 264.2 lb

## 2015-06-12 DIAGNOSIS — I10 Essential (primary) hypertension: Secondary | ICD-10-CM | POA: Diagnosis not present

## 2015-06-12 NOTE — Progress Notes (Signed)
   Subjective:    Patient ID: [name] [name], male    DOB: May 10, 1955, 60 y.o.   MRN: [id number]  HPI The patient comes in today for a wellness visit.    A review of their health history was completed.  A review of medications was also completed.  Any needed refills; no  Eating habits: try to eat healthy  Falls/  MVA accidents in past few months: none  Regular exercise: Patient reports regular exercise  Specialist pt sees on regular basis: no  Preventative health issues were discussed.   Additional concerns: none    Review of Systems  Constitutional: Negative for activity change, appetite change and fatigue.  HENT: Negative for congestion.   Respiratory: Negative for cough.   Cardiovascular: Negative for chest pain.  Gastrointestinal: Negative for abdominal pain.  Endocrine: Negative for polydipsia and polyphagia.  Genitourinary: Positive for frequency. Negative for flank pain, discharge and difficulty urinating.  Neurological: Negative for weakness.  Psychiatric/Behavioral: Negative for confusion.       Objective:   Physical Exam  Constitutional: He appears well-developed and well-nourished.  HENT:  Head: Normocephalic and atraumatic.  Right Ear: External ear normal.  Left Ear: External ear normal.  Nose: Nose normal.  Mouth/Throat: Oropharynx is clear and moist.  Eyes: EOM are normal. Pupils are equal, round, and reactive to light.  Neck: Normal range of motion. Neck supple. No thyromegaly present.  Cardiovascular: Normal rate, regular rhythm and normal heart sounds.   No murmur heard. Pulmonary/Chest: Effort normal and breath sounds normal. No respiratory distress. He has no wheezes.  Abdominal: Soft. Bowel sounds are normal. He exhibits no distension and no mass. There is no tenderness.  Genitourinary: Penis normal.  Musculoskeletal: Normal range of motion. He exhibits no edema.  Lymphadenopathy:    He has no cervical adenopathy.  Neurological: He is  alert. He exhibits normal muscle tone.  Skin: Skin is warm and dry. No erythema.  Psychiatric: He has a normal mood and affect. His behavior is normal. Judgment normal.   Rectal exam normal patient does not have a prostate       Assessment & Plan:  Wellness safety dietary all discussed Encourage patient excise on regular basis Lab work is followed by the [place] and by the city. Blood pressure good control continue current measures Follow-up 6 months

## 2015-08-16 ENCOUNTER — Telehealth: Payer: Self-pay | Admitting: Family Medicine

## 2015-08-16 NOTE — Telephone Encounter (Signed)
Pt dropped off his bp readings. In yellow folder in dr's office.

## 2015-08-16 NOTE — Telephone Encounter (Signed)
I reviewed over his blood pressure readings. To many of his blood pressure readings are above what I would consider to be good. Ideally we would like to see is top number in the 120s or 130s and his bottom number consistently in the 80s. Therefore I would recommend patient increase cardura from 2 mg to a new dose of 4 mg daily at bedtime I would also recommend that the patient follow-up within 6-8 weeks to recheck blood pressure in please bring readings with him. Please also update his Epic medication list. He may have #30 with 5 refills of the new dose

## 2015-08-17 MED ORDER — DOXAZOSIN MESYLATE 4 MG PO TABS: 4.0000 mg | ORAL_TABLET | Freq: Every day | ORAL | Status: DC

## 2015-08-17 NOTE — Telephone Encounter (Signed)
Notified patient reviewed over his blood pressure readings. To many of his blood pressure readings are above what would consider to be good. Ideally we would like to see is top number in the 120s or 130s and his bottom number consistently in the 80s. Therefore would recommend patient increase cardura from 2 mg to a new dose of 4 mg daily at bedtime would also recommend that the patient follow-up within 6-8 weeks to recheck blood pressure in please bring readings with him. Med sent to pharmacy. Patient verbalized understanding.

## 2015-08-22 ENCOUNTER — Encounter: Payer: Self-pay | Admitting: Family Medicine

## 2015-08-22 ENCOUNTER — Ambulatory Visit (INDEPENDENT_AMBULATORY_CARE_PROVIDER_SITE_OTHER): Payer: PRIVATE HEALTH INSURANCE | Admitting: Family Medicine

## 2015-08-22 VITALS — BP 122/80 | Temp 98.3°F | Ht 72.25 in | Wt 276.4 lb

## 2015-08-22 DIAGNOSIS — J069 Acute upper respiratory infection, unspecified: Secondary | ICD-10-CM

## 2015-08-22 DIAGNOSIS — J019 Acute sinusitis, unspecified: Secondary | ICD-10-CM

## 2015-08-22 DIAGNOSIS — B9689 Other specified bacterial agents as the cause of diseases classified elsewhere: Secondary | ICD-10-CM

## 2015-08-22 MED ORDER — HYDROCODONE-HOMATROPINE 5-1.5 MG/5ML PO SYRP: 5.0000 mL | ORAL_SOLUTION | Freq: Four times a day (QID) | ORAL | Status: DC | PRN

## 2015-08-22 MED ORDER — AMOXICILLIN 500 MG PO TABS: 500.0000 mg | ORAL_TABLET | Freq: Three times a day (TID) | ORAL | Status: DC

## 2015-08-22 NOTE — Progress Notes (Signed)
   Subjective:    Patient ID: [name] [name], male    DOB: Jul 20, 1955, 60 y.o.   MRN: [id number]  Sinusitis This is a new problem. The current episode started in the past 7 days. The problem is unchanged. There has been no fever. Associated symptoms include congestion, coughing and a sore throat. Pertinent negatives include no ear pain. Past treatments include oral decongestants (cough medicine). The treatment provided no relief.   Patient has no other concerns at this time.  Recently adjusted blood pressure medicine.  Review of Systems  Constitutional: Negative for fever and activity change.  HENT: Positive for congestion, rhinorrhea and sore throat. Negative for ear pain.   Eyes: Negative for discharge.  Respiratory: Positive for cough. Negative for wheezing.   Cardiovascular: Negative for chest pain.       Objective:   Physical Exam  Constitutional: He appears well-developed.  HENT:  Head: Normocephalic.  Mouth/Throat: Oropharynx is clear and moist. No oropharyngeal exudate.  Neck: Normal range of motion.  Cardiovascular: Normal rate, regular rhythm and normal heart sounds.   No murmur heard. Pulmonary/Chest: Effort normal and breath sounds normal. He has no wheezes.  Lymphadenopathy:    He has no cervical adenopathy.  Neurological: He exhibits normal muscle tone.  Skin: Skin is warm and dry.  Nursing note and vitals reviewed.         Assessment & Plan:  Viral illness Secondary rhinosinusitis Antibodies prescribed warning signs discussed Cough medication as needed If progressive troubles or if worse follow-up Blood pressure good on recheck.

## 2015-09-04 ENCOUNTER — Telehealth: Payer: Self-pay | Admitting: Family Medicine

## 2015-09-04 NOTE — Telephone Encounter (Signed)
Pt dropped off results for the dr to review. Results in yellow folder in dr office.

## 2015-09-05 ENCOUNTER — Encounter: Payer: Self-pay | Admitting: Family Medicine

## 2015-09-05 NOTE — Telephone Encounter (Signed)
I reviewed over the lab work lab work looked good a letter was sent to the patient kidney function stable with creatinine 1.56 patient was encouraged continue medication follow-up as regular visits.

## 2015-10-01 ENCOUNTER — Other Ambulatory Visit: Payer: Self-pay | Admitting: *Deleted

## 2015-10-01 MED ORDER — PREDNISONE 10 MG (48) PO TBPK: ORAL_TABLET | ORAL | Status: DC

## 2015-10-09 ENCOUNTER — Ambulatory Visit (INDEPENDENT_AMBULATORY_CARE_PROVIDER_SITE_OTHER): Payer: PRIVATE HEALTH INSURANCE | Admitting: Urology

## 2015-10-09 ENCOUNTER — Other Ambulatory Visit (HOSPITAL_COMMUNITY)
Admission: RE | Admit: 2015-10-09 | Discharge: 2015-10-09 | Disposition: A | Discharge status: Home / Self Care | Payer: PRIVATE HEALTH INSURANCE | Source: Other Acute Inpatient Hospital | Attending: Urology | Admitting: Urology

## 2015-10-09 DIAGNOSIS — C61 Malignant neoplasm of prostate: Secondary | ICD-10-CM | POA: Diagnosis not present

## 2015-10-09 DIAGNOSIS — N5231 Erectile dysfunction following radical prostatectomy: Secondary | ICD-10-CM | POA: Diagnosis not present

## 2015-10-09 DIAGNOSIS — Z8546 Personal history of malignant neoplasm of prostate: Secondary | ICD-10-CM

## 2015-10-09 LAB — URINALYSIS, ROUTINE W REFLEX MICROSCOPIC
Bilirubin Urine: NEGATIVE
Glucose, UA: NEGATIVE mg/dL
KETONES UR: NEGATIVE mg/dL
NITRITE: NEGATIVE
PROTEIN: NEGATIVE mg/dL
Specific Gravity, Urine: 1.01 (ref 1.005–1.030)
pH: 7 (ref 5.0–8.0)

## 2015-10-09 LAB — URINE MICROSCOPIC-ADD ON: SQUAMOUS EPITHELIAL / LPF: NONE SEEN

## 2015-10-18 ENCOUNTER — Telehealth: Payer: Self-pay | Admitting: Orthopedic Surgery

## 2015-10-18 NOTE — Telephone Encounter (Signed)
Patient stopped by office with question about recurring achilles tendonitis problem.  States he is aware of the previous recommendations - prednisone, which was refilled, ice, stretching.  He is not clear on stretching exercises. Please advise of this and/or of any other medication or recommendations he may try.  I have scheduled a follow up appointment for next available, 11/13/15.  His cell# is [phone]

## 2015-10-26 ENCOUNTER — Other Ambulatory Visit: Payer: Self-pay | Admitting: Family Medicine

## 2015-11-13 ENCOUNTER — Encounter: Payer: Self-pay | Admitting: Orthopedic Surgery

## 2015-11-13 ENCOUNTER — Telehealth: Payer: Self-pay | Admitting: Orthopedic Surgery

## 2015-11-13 ENCOUNTER — Ambulatory Visit (INDEPENDENT_AMBULATORY_CARE_PROVIDER_SITE_OTHER): Payer: PRIVATE HEALTH INSURANCE | Admitting: Orthopedic Surgery

## 2015-11-13 VITALS — BP 151/102 | Ht 75.0 in | Wt 276.0 lb

## 2015-11-13 DIAGNOSIS — M7662 Achilles tendinitis, left leg: Secondary | ICD-10-CM | POA: Diagnosis not present

## 2015-11-13 DIAGNOSIS — M7661 Achilles tendinitis, right leg: Secondary | ICD-10-CM

## 2015-11-13 MED ORDER — DICLOFENAC POTASSIUM 50 MG PO TABS: 50.0000 mg | ORAL_TABLET | Freq: Two times a day (BID) | ORAL | 3 refills | Status: DC

## 2015-11-13 NOTE — Telephone Encounter (Signed)
ADVISED SODIUM

## 2015-11-13 NOTE — Telephone Encounter (Signed)
[place] called wanting to  know  want to if the diclofenac is Sodium or Potassium Qty 90 Tablets and 50mg   Please call and advise

## 2015-11-13 NOTE — Progress Notes (Signed)
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  No trauma  Review of systems Review of Systems  Musculoskeletal: Positive for myalgias.   BP (!) 151/102   Ht 6\' 3"  (1.905 m)   Wt 276 lb (125.2 kg)   BMI 34.50 kg/m  Awake alert and oriented 3 mood and affect normal gait and station normal  Tenderness on the posterior aspects of both feet at the Achilles tendon insertion with normal range of motion in both ankles both ankles are stable. Plantar flexion dorsiflexion strength normal. Plantar aspect of the heel nontender  Dorsalis pedis pulses normal in both feet  Impression Achilles tendinitis.  Plan regular NSAIDs for 6 weeks and then standing dose of prednisone if he gets an acute attack and worsening symptoms  Follow-up as needed

## 2015-11-13 NOTE — Patient Instructions (Addendum)

## 2015-11-26 ENCOUNTER — Other Ambulatory Visit: Payer: Self-pay | Admitting: Family Medicine

## 2015-12-10 ENCOUNTER — Ambulatory Visit (INDEPENDENT_AMBULATORY_CARE_PROVIDER_SITE_OTHER): Payer: PRIVATE HEALTH INSURANCE | Admitting: Family Medicine

## 2015-12-10 ENCOUNTER — Encounter: Payer: Self-pay | Admitting: Family Medicine

## 2015-12-10 VITALS — BP 136/90 | Ht 75.0 in | Wt 269.2 lb

## 2015-12-10 DIAGNOSIS — E785 Hyperlipidemia, unspecified: Secondary | ICD-10-CM

## 2015-12-10 DIAGNOSIS — I10 Essential (primary) hypertension: Secondary | ICD-10-CM | POA: Diagnosis not present

## 2015-12-10 MED ORDER — INDAPAMIDE 2.5 MG PO TABS: 2.5000 mg | ORAL_TABLET | Freq: Every day | ORAL | 5 refills | Status: DC

## 2015-12-10 MED ORDER — CRESTOR 20 MG PO TABS: ORAL_TABLET | ORAL | 5 refills | Status: DC

## 2015-12-10 MED ORDER — LOSARTAN POTASSIUM 100 MG PO TABS: 100.0000 mg | ORAL_TABLET | Freq: Every day | ORAL | 5 refills | Status: DC

## 2015-12-10 NOTE — Progress Notes (Signed)
   Subjective:    Patient ID: [name] [name], male    DOB: [birth year], 60 y.o.   MRN: [id number]  Hypertension  This is a chronic problem. The current episode started more than 1 year ago. Pertinent negatives include no chest pain. There are no compliance problems.    Patient states no other concerns this visit.   Patient relates he tries exercise watch diet and takes his medicine denies any other particular troubles  Review of Systems  Constitutional: Negative for activity change, appetite change and fatigue.  HENT: Negative for congestion.   Respiratory: Negative for cough.   Cardiovascular: Negative for chest pain.  Gastrointestinal: Negative for abdominal pain.  Endocrine: Negative for polydipsia and polyphagia.  Neurological: Negative for weakness.  Psychiatric/Behavioral: Negative for confusion.       Objective:   Physical Exam  Constitutional: He appears well-nourished. No distress.  Cardiovascular: Normal rate, regular rhythm and normal heart sounds.   No murmur heard. Pulmonary/Chest: Effort normal and breath sounds normal. No respiratory distress.  Musculoskeletal: He exhibits no edema.  Lymphadenopathy:    He has no cervical adenopathy.  Neurological: He is alert.  Psychiatric: His behavior is normal.  Vitals reviewed.  He does have a little bit of swelling in the ankles nothing severe blood pressure best reading 138/88 this is above where we would want to be       Assessment & Plan:  HTN subpar control the importance of getting this under control discussed with the patient including exercise watching diet keeping weight under check and taking medication. We will make an adjustment in his medication I recommend losartan 100 mg daily, stop HCTZ component, start Lozol 2.5 mg every morning, patient to recheck blood pressure on a regular basis follow-up in 4-6 weeks Check metabolic 7 in approximately 3-4 weeks  Hyperlipidemia lab work from the [place] was reviewed  cholesterol under good control continue current medication  Hypocalcemia on recent lab work from the city this to be re-looked at with his metabolic 7

## 2015-12-26 ENCOUNTER — Other Ambulatory Visit: Payer: Self-pay | Admitting: Family Medicine

## 2015-12-27 ENCOUNTER — Other Ambulatory Visit: Payer: Self-pay | Admitting: Family Medicine

## 2015-12-27 LAB — BASIC METABOLIC PANEL
BUN: 16 mg/dL (ref 7–25)
CHLORIDE: 103 mmol/L (ref 98–110)
CO2: 32 mmol/L — ABNORMAL HIGH (ref 20–31)
Calcium: 8.7 mg/dL (ref 8.6–10.3)
Creat: 1.37 mg/dL — ABNORMAL HIGH (ref 0.70–1.25)
GLUCOSE: 103 mg/dL — AB (ref 65–99)
POTASSIUM: 3.3 mmol/L — AB (ref 3.5–5.3)
SODIUM: 144 mmol/L (ref 135–146)

## 2015-12-28 ENCOUNTER — Other Ambulatory Visit: Payer: Self-pay | Admitting: *Deleted

## 2015-12-28 DIAGNOSIS — E876 Hypokalemia: Secondary | ICD-10-CM

## 2015-12-28 DIAGNOSIS — Z79899 Other long term (current) drug therapy: Secondary | ICD-10-CM

## 2015-12-28 MED ORDER — POTASSIUM CHLORIDE CRYS ER 20 MEQ PO TBCR: EXTENDED_RELEASE_TABLET | ORAL | 5 refills | Status: DC

## 2016-01-11 LAB — BASIC METABOLIC PANEL
BUN/Creatinine Ratio: 11 (ref 10–24)
BUN: 18 mg/dL (ref 8–27)
CALCIUM: 8.8 mg/dL (ref 8.6–10.2)
CO2: 28 mmol/L (ref 18–29)
Chloride: 104 mmol/L (ref 96–106)
Creatinine, Ser: 1.59 mg/dL — ABNORMAL HIGH (ref 0.76–1.27)
GFR, EST AFRICAN AMERICAN: 53 mL/min/{1.73_m2} — AB (ref >59)
GFR, EST NON AFRICAN AMERICAN: 46 mL/min/{1.73_m2} — AB (ref >59)
Glucose: 106 mg/dL — ABNORMAL HIGH (ref 65–99)
POTASSIUM: 3.5 mmol/L (ref 3.5–5.2)
Sodium: 144 mmol/L (ref 134–144)

## 2016-01-11 LAB — MAGNESIUM: Magnesium: 2.1 mg/dL (ref 1.6–2.3)

## 2016-01-14 ENCOUNTER — Ambulatory Visit (INDEPENDENT_AMBULATORY_CARE_PROVIDER_SITE_OTHER): Payer: PRIVATE HEALTH INSURANCE | Admitting: Family Medicine

## 2016-01-14 ENCOUNTER — Encounter: Payer: Self-pay | Admitting: Family Medicine

## 2016-01-14 VITALS — BP 134/84 | Ht 75.0 in | Wt 267.0 lb

## 2016-01-14 DIAGNOSIS — N289 Disorder of kidney and ureter, unspecified: Secondary | ICD-10-CM

## 2016-01-14 DIAGNOSIS — I10 Essential (primary) hypertension: Secondary | ICD-10-CM | POA: Diagnosis not present

## 2016-01-14 NOTE — Progress Notes (Addendum)
   Subjective:    Patient ID: [name] [name], male    DOB: 06-Jul-1955, 60 y.o.   MRN: [id number]  HPI  Patient in today to discuss recent lab work and follow up on Hypocalcemia.  This patient had a low calcium on a recent city bloodwork. But now that actually looks better. On previous exam we did change his diuretic in order to get better blood pressure control he is tolerating this medicine well Has concerns of bruise to left forearm. He has noted a bruise on his left forearm that occurred after they drew blood from him. He is having some left shoulder pain but he plans on seen orthopedics for this    Review of Systems No chest tightness pressure pain or shortness of breath moods overall are doing well    Objective:   Physical Exam Blood pressure with large cuff 118/76 lungs are clear no crackles heart regular pulse normal bruising left forearm noted recent lab work reviewed potassium now doing better with increase potassium supplement creatinine slightly elevated but blood pressure under great control  Patient was cautioned to avoid anti-inflammatories     Assessment & Plan:  HTN much better control continue current measures follow-up 6 months  [facility] work he will send [place] the results of this  Mild elevation of creatinine is best approach is to avoid excessive protein in the diet we will monitor this going forward in addition to this regular physical activity keeping weight down and keeping blood pressure under good control

## 2016-01-24 ENCOUNTER — Telehealth: Payer: Self-pay | Admitting: Family Medicine

## 2016-01-24 NOTE — Telephone Encounter (Signed)
Kidney functions look good cholesterol overall looks good continue everything as is. Follow-up by spring time

## 2016-01-24 NOTE — Telephone Encounter (Signed)
Pt dropped off some lab results, results in folder in dr. Gabriel Carina.

## 2016-01-25 NOTE — Telephone Encounter (Signed)
Discussed with pt

## 2016-02-27 ENCOUNTER — Other Ambulatory Visit: Payer: Self-pay | Admitting: Family Medicine

## 2016-03-22 ENCOUNTER — Other Ambulatory Visit: Payer: Self-pay | Admitting: Family Medicine

## 2016-04-01 ENCOUNTER — Encounter: Payer: Self-pay | Admitting: Orthopedic Surgery

## 2016-04-01 ENCOUNTER — Telehealth: Payer: Self-pay | Admitting: Orthopedic Surgery

## 2016-04-01 ENCOUNTER — Ambulatory Visit (INDEPENDENT_AMBULATORY_CARE_PROVIDER_SITE_OTHER): Payer: PRIVATE HEALTH INSURANCE | Admitting: Orthopedic Surgery

## 2016-04-01 VITALS — BP 129/78 | HR 79 | Ht 74.0 in | Wt 269.0 lb

## 2016-04-01 DIAGNOSIS — M7662 Achilles tendinitis, left leg: Secondary | ICD-10-CM | POA: Diagnosis not present

## 2016-04-01 DIAGNOSIS — M25512 Pain in left shoulder: Secondary | ICD-10-CM

## 2016-04-01 DIAGNOSIS — M7661 Achilles tendinitis, right leg: Secondary | ICD-10-CM | POA: Diagnosis not present

## 2016-04-01 MED ORDER — PREDNISONE 10 MG PO TABS: ORAL_TABLET | ORAL | 5 refills | Status: DC

## 2016-04-01 NOTE — Telephone Encounter (Signed)
Pharmacy called questioning about the prescription for Prednisone 10 Mg  Qty 60 Tablets.  [facility] stated that Prednisone is not usually as needed.  Take 1-2 tablets every 4-6 hours as needed.  It was e-script  Please call and advise

## 2016-04-01 NOTE — Telephone Encounter (Signed)
Its the way I ordered it

## 2016-04-01 NOTE — Progress Notes (Signed)
Patient ID: [name] [name], male   DOB: 12/28/1955, 60 y.o.   MRN: [id number]  Chief Complaint  Patient presents with  . Follow-up    Achilles tendonitis    HPI [name] [name] is a 60 y.o. male.   HPI  [name] [name] follows up for bilateral Achilles tendinitis she's also complaining of anterior shin pain and some left shoulder pain however, these have been relieved by taking 04-22-08 mg prednisone tablets on an intermittent basis  It seems that his incline walking on the treadmill may have aggravated his anterior compartment musculature but it is now better  Review of Systems Review of Systems No numbness or tingling no skin rashes noted  Physical Exam  Physical Exam  On exam we find a well-developed well-nourished male muscular build oriented 3 mood and affect pleasant no gait disturbance no tenderness in the anterior shin or right or left Achilles good plantar flexion power in both right and left ankles full range of motion of the shoulder good strength in the rotator cuff on the left shoulder no instability  Impression Bilateral Achilles tendinitis Anterior compartment musculature pain Left shoulder pain  I wrote him for some orthotics warm-and-form [place]  And he can take 1-2 prednisone tablets as needed for inflammation  Follow-up as needed

## 2016-04-01 NOTE — Telephone Encounter (Signed)
ROUTING TO DR HARRISON TO ADVISE 

## 2016-04-02 NOTE — Telephone Encounter (Signed)
PHARMACY ADVISED

## 2016-04-10 ENCOUNTER — Other Ambulatory Visit: Payer: Self-pay | Admitting: Family Medicine

## 2016-04-15 NOTE — Telephone Encounter (Signed)
I would recommend that the patient be notified to talk with [place] regarding this medicine to be certain he would like to be on this again since it is not on the medicine list

## 2016-05-23 ENCOUNTER — Other Ambulatory Visit: Payer: Self-pay | Admitting: Family Medicine

## 2016-05-28 ENCOUNTER — Other Ambulatory Visit: Payer: Self-pay | Admitting: Family Medicine

## 2016-06-24 ENCOUNTER — Other Ambulatory Visit: Payer: Self-pay | Admitting: Family Medicine

## 2016-07-14 ENCOUNTER — Encounter: Payer: PRIVATE HEALTH INSURANCE | Admitting: Family Medicine

## 2016-07-21 ENCOUNTER — Ambulatory Visit (INDEPENDENT_AMBULATORY_CARE_PROVIDER_SITE_OTHER): Payer: PRIVATE HEALTH INSURANCE | Admitting: Family Medicine

## 2016-07-21 ENCOUNTER — Encounter: Payer: Self-pay | Admitting: Family Medicine

## 2016-07-21 VITALS — BP 132/94 | Ht 73.25 in | Wt 267.0 lb

## 2016-07-21 DIAGNOSIS — E784 Other hyperlipidemia: Secondary | ICD-10-CM

## 2016-07-21 DIAGNOSIS — I10 Essential (primary) hypertension: Secondary | ICD-10-CM

## 2016-07-21 DIAGNOSIS — G609 Hereditary and idiopathic neuropathy, unspecified: Secondary | ICD-10-CM | POA: Diagnosis not present

## 2016-07-21 DIAGNOSIS — E7849 Other hyperlipidemia: Secondary | ICD-10-CM

## 2016-07-21 DIAGNOSIS — Z0000 Encounter for general adult medical examination without abnormal findings: Secondary | ICD-10-CM

## 2016-07-21 NOTE — Progress Notes (Signed)
   Subjective:    Patient ID: [name] [name], male    DOB: 18-Apr-1956, 61 y.o.   MRN: [id number]  HPI The patient comes in today for a wellness visit.    A review of their health history was completed.  A review of medications was also completed.  Any needed refills; needs update on all meds  Eating habits: not health conscious  Falls/  MVA accidents in past few months: none  Regular exercise: 2-3 times a week  Specialist pt sees on regular basis: DR. Braulio Conte  Preventative health issues were discussed.   Additional concerns: none Patient with significant neuropathy in his feet he often feels like there is tingling in his having a hard time feeling them at times it makes it difficult with his walking it is worse than what it was several years ago was in his hands but that went away then recently over the past 6 months his started in his toes now appears to him to be above the ankle region he denies any injury with it denies exposure to chemicals.  Patient is frustrated by his lack of strength he states he used to be able to lift 400 pounds now he can only lift 250 pounds he also has more body fat many used to have he has a history of prostate cancer and prostate was removed. His PSAs a been 0 and he is followed by specialist on a yearly basis  He does have renal insufficiency that has been counseled in the past keep blood pressure under control and to minimize protein in his diet   Review of Systems  Constitutional: Negative for activity change, fatigue and fever.  Respiratory: Negative for cough and shortness of breath.   Cardiovascular: Negative for chest pain and leg swelling.  Neurological: Negative for headaches.       Objective:   Physical Exam  Constitutional: He appears well-nourished. No distress.  Cardiovascular: Normal rate, regular rhythm and normal heart sounds.   No murmur heard. Pulmonary/Chest: Effort normal and breath sounds normal. No respiratory  distress.  Musculoskeletal: He exhibits no edema.  Lymphadenopathy:    He has no cervical adenopathy.  Neurological: He is alert.  Psychiatric: His behavior is normal.  Vitals reviewed.   No need to do a prostate exam he is had a prostatectomy foot exam normal ankles no swelling      Assessment & Plan:  Adult wellness-complete.wellness physical was conducted today. Importance of diet and exercise were discussed in detail. In addition to this a discussion regarding safety was also covered. We also reviewed over immunizations and gave recommendations regarding current immunization needed for age. In addition to this additional areas were also touched on including: Preventative health exams needed: Colonoscopy up-to-date next colonoscopy 2020  Patient was advised yearly wellness exam  Complex high blood pressure refills given blood pressure under decent control watch diet minimize protein in the diet  Patient had lipid profile through the [place] in the fall no need to repeat this but his lab work was reviewed with him he is to continue his medicine  Has progressive peripheral neuropathy of the feet referral to [facility] patient had a normal nerve conduction study approximately 4 years ago locally we will check B12 thyroid function but this is been normal in the past  Renal insufficiency recheck metabolic 7 keep blood pressure under good control

## 2016-07-22 LAB — BASIC METABOLIC PANEL
BUN / CREAT RATIO: 11 (ref 10–24)
BUN: 17 mg/dL (ref 8–27)
CO2: 30 mmol/L — ABNORMAL HIGH (ref 18–29)
Calcium: 8.6 mg/dL (ref 8.6–10.2)
Chloride: 101 mmol/L (ref 96–106)
Creatinine, Ser: 1.5 mg/dL — ABNORMAL HIGH (ref 0.76–1.27)
GFR, EST AFRICAN AMERICAN: 57 mL/min/{1.73_m2} — AB (ref >59)
GFR, EST NON AFRICAN AMERICAN: 50 mL/min/{1.73_m2} — AB (ref >59)
Glucose: 95 mg/dL (ref 65–99)
POTASSIUM: 3.5 mmol/L (ref 3.5–5.2)
SODIUM: 145 mmol/L — AB (ref 134–144)

## 2016-07-22 LAB — VITAMIN B12

## 2016-07-22 LAB — TSH: TSH: 0.988 u[IU]/mL (ref 0.450–4.500)

## 2016-07-25 ENCOUNTER — Other Ambulatory Visit: Payer: Self-pay | Admitting: Family Medicine

## 2016-08-04 ENCOUNTER — Ambulatory Visit: Payer: PRIVATE HEALTH INSURANCE | Admitting: Neurology

## 2016-08-05 ENCOUNTER — Ambulatory Visit: Payer: Self-pay | Admitting: Neurology

## 2016-08-20 ENCOUNTER — Ambulatory Visit (INDEPENDENT_AMBULATORY_CARE_PROVIDER_SITE_OTHER): Payer: PRIVATE HEALTH INSURANCE | Admitting: Neurology

## 2016-08-20 ENCOUNTER — Encounter: Payer: Self-pay | Admitting: Neurology

## 2016-08-20 VITALS — BP 138/93 | HR 63 | Ht 75.0 in | Wt 272.4 lb

## 2016-08-20 DIAGNOSIS — M5416 Radiculopathy, lumbar region: Secondary | ICD-10-CM | POA: Diagnosis not present

## 2016-08-20 DIAGNOSIS — R202 Paresthesia of skin: Secondary | ICD-10-CM

## 2016-08-20 DIAGNOSIS — G609 Hereditary and idiopathic neuropathy, unspecified: Secondary | ICD-10-CM

## 2016-08-20 MED ORDER — GABAPENTIN 300 MG PO CAPS: 300.0000 mg | ORAL_CAPSULE | Freq: Three times a day (TID) | ORAL | 11 refills | Status: DC

## 2016-08-20 NOTE — Progress Notes (Addendum)
[facility]    Provider:  Dr Jaynee Eagles Referring Provider: Kathyrn Drown, MD Primary Care Physician:  Sallee Lange, MD  CC:  Polyneuropathy  HPI:  [name] [name] is a 61 y.o. male here as a referral from Dr. Wolfgang Phoenix for polyneuropathy. Past medical history hypertension, renal insufficiency, hyperlipidemia, idiopathic chronic polyneuropathy. Symptoms started around 2013. Saw a neurologist in 2014 and Dxed with small-fiber neuropathy. Started in the left foot and now in both feet. Hands without symptoms. He has tingling, burning and numbness, worse at night. Worse on the right foot. Symptoms in the toes and below the toes and some of the top of the foot. Continuous all day long. Sometimes the pain radiates up to the left hip. Feels like it is numb. No problems with balance or walking. Slowly progressive. Heat does not help. Aspercreme helped. He has occasional back pain. He denies exposure to chemicals, trauma, new medications at onset or any inciting events. 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 He was in [place] in [place] notes, labs and imaging from outside physicians, which showed:  Primary care notes. Patient has significant neuropathy in the feet which is chronic. He often feels like there is tingling and having a hard time feeling them making it difficult to walk. It's slowly progressive and worse than it was several years ago. Over the past 6 months it's progressed even quicker and it's above the ankle region. He denies chemical exposure or injury or any inciting event. He also is frustrated by lack of strength used to be able to lift 400 pounds now he can only left 250 pounds. He does have renal insufficiency. Reviewed physical exam which was normal. Patient had normal nerve conduction studies approximately 4 years ago.  B12 greater than 2000, TSH within  normal limits 07/21/2016.  MRI of the lumbar spine 2001: Reviewed report below.   LOW BACK PAIN PARTICULARLY WHEN STANDING, SITTING, AND WALKING. MRI OF THE LUMBAR SPINE WITHOUT CONTRAST MEDIA: MULTIPLANAR AND MULTISEQUENCE IMAGES WERE ACQUIRED ACCORDING TO THE STANDARD PROTOCOL  COMPARISON IS MADE TO THE 04/25/99 SCAN FROM [facility]. AGAIN APPRECIATED IS THE CENTRAL DISK PROTRUSION AT L4-5.  HOWEVER, THERE IS NOW A COMPONENT ON THE RIGHT WHICH IS NOTED JUST MEDIAL TO THE FORAMEN AND INVOLVES THE RIGHT LATERAL ASPECT OF THE CENTRAL CANAL.  JUST BELOW THE SUPERIOR END-PLATE DISK MATERIAL ENCROACHES ON AND POSTERIORLY DISPLACES THE RIGHT L5 NERVE ROOT.  THIS IS NOTED WITHIN THE SUPERIOR ASPECT OF THE RIGHT L5 LATERAL RECESS.  DOES THE PATIENT HAVE A RIGHT L5 RADICULOPATHY? FACET HYPERTROPHY IS PRESENT AT ALL LEVELS.  THERE IS ALSO LIGAMENTA FLAVAL THICKENING AS WELL. 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  THERE IS NO EVIDENCE OF CONSTRICTION OF THE THECAL SAC AT ANY LEVEL.  THERE IS DISKAL DEHYDRATION AT L3-4 AND L4-5 WITH MINIMAL DISK SPACE NARROWING AT L4-5. IMPRESSION DEGENERATIVE LUMBAR SPONDYLOTIC CHANGES AT MULTIPLE LEVELS. AGAIN APPRECIATED IS A SMALL CENTRAL DISK HERNIATION AT L4-5.THE PATIENT HAS DEVELOPED A RIGHT-SIDED HNP AT L4-5 AS DESCRIBED ABOVE WITH MASS EFFECT ON THE RIGHT L5 NERVE ROOT.  Reviewed EMG/NCS Dr. Merlene Laughter which was normal and diagnosed with small-fiber neuropathy  Review of Systems: Patient complains of symptoms per HPI as well as the following symptoms: Incontinence. Pertinent negatives per HPI. All others negative.   Social  History   Social History  . Marital status: Married    Spouse name: N/A  . Number of children: N/A  . Years of education: N/A   Occupational History  .  [place] History Main Topics  . Smoking status: Never Smoker  . Smokeless tobacco: Never Used  . Alcohol  use No  . Drug use: No  . Sexual activity: Not on file   Other Topics Concern  . Not on file   Social History Narrative  . No narrative on file    Family History  Problem Relation Age of Onset  . Colon cancer Neg Hx   . Colon polyps Neg Hx   . Neuropathy Neg Hx     Past Medical History:  Diagnosis Date  . Adenomatous colon polyp 2007 [place]  . DDD (degenerative disc disease)   . GERD (gastroesophageal reflux disease)   . Herniated disc    Back  . HTN (hypertension)   . Hyperlipemia   . Prostate CA [facility]) 2004    Past Surgical History:  Procedure Laterality Date  . COLONOSCOPY  11/15/2010   Dr. Fields:Pedunculated polyp/pan-colonic  TICS/mod IH. tubular adenoma  . COLONOSCOPY  2007   Dr. Oneida Alar: 3 mm tubular adeoma  . COLONOSCOPY N/A 03/08/2014   Procedure: COLONOSCOPY;  Surgeon: Danie Binder, MD;  Location: [facility];  Service: Endoscopy;  Laterality: N/A;  100 - moved to 12:45 - Ginger to notify pt  . KNEE ARTHROSCOPY W/ MENISCECTOMY  2011 RIGHT  . LAMINECTOMY AND MICRODISCECTOMY LUMBAR SPINE  2002  . PROSTATECTOMY      Current Outpatient Prescriptions  Medication Sig Dispense Refill  . aspirin 81 MG tablet Take 81 mg by mouth daily.    Marland Kitchen b complex vitamins capsule Take 1 capsule by mouth daily.    . cetirizine (ZYRTEC) 10 MG tablet TAKE ONE (1) TABLET BY MOUTH EVERY DAY 30 tablet 0  . CRESTOR 20 MG tablet TAKE ONE (1) TABLET EACH DAY 30 tablet 5  . doxazosin (CARDURA) 4 MG tablet TAKE 1 TABLET (4MG) BY MOUTH AT BEDTIME 30 tablet 5  . HM OMEPRAZOLE 20 MG TBEC TAKE ONE TABLET ONCE DAILY 28 each 1  . indapamide (LOZOL) 2.5 MG tablet TAKE ONE (1) TABLET EACH DAY 30 tablet 3  . losartan (COZAAR) 100 MG tablet TAKE ONE (1) TABLET BY MOUTH EVERY DAY 30 tablet 3  . metoprolol succinate (TOPROL-XL) 50 MG 24 hr tablet TAKE ONE (1) TABLET EACH DAY 30 tablet 5  . Multiple Vitamin (MULTIVITAMIN) capsule Take 1 capsule by mouth daily.      Marland Kitchen NIFEdipine (PROCARDIA  XL/ADALAT-CC) 90 MG 24 hr tablet TAKE ONE (1) TABLET EACH DAY 30 tablet 5  . Omega-3 Fatty Acids (FISH OIL) 1200 MG CAPS Take by mouth daily.    . potassium chloride SA (K-DUR,KLOR-CON) 20 MEQ tablet TAKE ONE TABLET IN THE MORNING AND TAKE TWO TABLETS EVERY EVENING 90 tablet 5  . predniSONE (DELTASONE) 10 MG tablet Take 1-2 tablets every 4-6 hours as needed 60 tablet 5  . sildenafil (REVATIO) 20 MG tablet Take 20 mg by mouth 3 (three) times daily.    Marland Kitchen triamcinolone cream (KENALOG) 0.1 % APPLY TO AFFECTED AREA(S) 2 TIMES A DAY 30 g 12  . gabapentin (NEURONTIN) 300 MG capsule Take 1 capsule (300 mg total) by mouth 3 (three) times daily. 90 capsule 11  . Loratadine (AF ALLERGY RELIEF PO) Take by  mouth.     No current facility-administered medications for this visit.     Allergies as of 08/20/2016  . (No Known Allergies)    Vitals: BP (!) 138/93 (BP Location: Right Arm, Patient Position: Sitting, Cuff Size: Normal)   Pulse 63   Ht '6\' 3"'  (1.905 m)   Wt 272 lb 6.4 oz (123.6 kg)   BMI 34.05 kg/m  Last Weight:  Wt Readings from Last 1 Encounters:  08/20/16 272 lb 6.4 oz (123.6 kg)   Last Height:   Ht Readings from Last 1 Encounters:  08/20/16 '6\' 3"'  (1.905 m)    Physical exam: Exam: Gen: NAD, conversant, well nourised, obese, well groomed                     CV: RRR, no MRG. No Carotid Bruits. No peripheral edema, warm, nontender Eyes: Conjunctivae clear without exudates or hemorrhage  Neuro: Detailed Neurologic Exam  Speech:    Speech is normal; fluent and spontaneous with normal comprehension.  Cognition:    The patient is oriented to person, place, and time;     recent and remote memory intact;     language fluent;     normal attention, concentration,     fund of knowledge Cranial Nerves:    The pupils are equal, round, and reactive to light. The fundi are normal and spontaneous venous pulsations are present. Visual fields are full to finger confrontation. Extraocular  movements are intact. Trigeminal sensation is intact and the muscles of mastication are normal. The face is symmetric. The palate elevates in the midline. Hearing intact. Voice is normal. Shoulder shrug is normal. The tongue has normal motion without fasciculations.   Coordination:    Normal finger to nose and heel to shin. Normal rapid alternating movements.   Gait:    Heel-toe and tandem gait are normal.   Motor Observation:    No asymmetry, no atrophy, and no involuntary movements noted. Tone:    Normal muscle tone.    Posture:    Posture is normal. normal erect    Strength:    Strength is V/V in the upper and lower limbs.      Sensation: intact to LT     Reflex Exam:  DTR's:    Deep tendon reflexes in the upper and lower extremities are normal bilaterally.   Toes:    The toes are downgoing bilaterally.   Clonus:    Clonus is absent.      Assessment/Plan:  This is a 61 year old male with chronic idiopathic polyneuropathy is worsening. He feels his left foot is worse and he also has some radiation into the leg which may be a superimposed radiculopathy.  MRI lumbar spine because right foot wors ethan left may be superimposed L5/S1 radic for possible steroid injections or surgical intervention Labs to try and find cause of idiopathic neuropathy, B12 and Tsh were fine  Addendum: Hgba1c 6.3. Labs are normal except for HgbA1c. He is very close to being diabetic. Even pre-diabetes can cause of his small-fiber neuropathy and he should return to Dr. Wolfgang Phoenix for management. May suggest starting metformin. Also start alpha-lipoic acid, 400-610m daily as this has been shown to improve diabetic neuropathy.Still need mri l-spine to eval for superimposed l5 radic.   Orders Placed This Encounter  Procedures  . MR LUMBAR SPINE WO CONTRAST  . B. burgdorfi Antibody  . Hemoglobin A1c  . Vitamin B1  . Angiotensin converting enzyme  . HIV antibody  .  Sedimentation rate  . ANA w/Reflex    . Sjogren's syndrome antibods(ssa + ssb)  . Pan-ANCA  . Hepatitis C antibody  . Rheumatoid factor  . Heavy metals, blood  . Vitamin B6  . Multiple Myeloma Panel (SPEP&IFE w/QIG)   Start gabapentin  Cc: Dr. Janene Harvey, [facility] [address] [place], [place] [address]  Phone [phone] Fax [phone]

## 2016-08-20 NOTE — Patient Instructions (Addendum)
Remember to drink plenty of fluid, eat healthy meals and do not skip any meals. Try to eat protein with a every meal and eat a healthy snack such as fruit or nuts in between meals. Try to keep a regular sleep-wake schedule and try to exercise daily, particularly in the form of walking, 20-30 minutes a day, if you can.   As far as your medications are concerned, I would like to suggest: Start with gabapentin   As far as diagnostic testing: Labs, MRI lumbar spine  I would like to see you back in 3-4 months, sooner if we need to. Please call us with any interim questions, concerns, problems, updates or refill requests.   Our phone number is [phone]. We also have an after hours call service for urgent matters and there is a physician on-call for urgent questions. For any emergencies you know to call 911 or go to the nearest emergency room  Gabapentin capsules or tablets What is this medicine? GABAPENTIN (GA ba pen tin) is used to control partial seizures in adults with epilepsy. It is also used to treat certain types of nerve pain. This medicine may be used for other purposes; ask your health care provider or pharmacist if you have questions. COMMON BRAND NAME(S): Active-PAC with Gabapentin, Gabarone, Neurontin What should I tell my health care provider before I take this medicine? They need to know if you have any of these conditions: -kidney disease -suicidal thoughts, plans, or attempt; a previous suicide attempt by you or a family member -an unusual or allergic reaction to gabapentin, other medicines, foods, dyes, or preservatives -pregnant or trying to get pregnant -breast-feeding How should I use this medicine? Take this medicine by mouth with a glass of water. Follow the directions on the prescription label. You can take it with or without food. If it upsets your stomach, take it with food.Take your medicine at regular intervals. Do not take it more often than directed. Do not stop  taking except on your doctor's advice. If you are directed to break the 600 or 800 mg tablets in half as part of your dose, the extra half tablet should be used for the next dose. If you have not used the extra half tablet within 28 days, it should be thrown away. A special MedGuide will be given to you by the pharmacist with each prescription and refill. Be sure to read this information carefully each time. Talk to your pediatrician regarding the use of this medicine in children. Special care may be needed. Overdosage: If you think you have taken too much of this medicine contact a poison control center or emergency room at once. NOTE: This medicine is only for you. Do not share this medicine with others. What if I miss a dose? If you miss a dose, take it as soon as you can. If it is almost time for your next dose, take only that dose. Do not take double or extra doses. What may interact with this medicine? Do not take this medicine with any of the following medications: -other gabapentin products This medicine may also interact with the following medications: -alcohol -antacids -antihistamines for allergy, cough and cold -certain medicines for anxiety or sleep -certain medicines for depression or psychotic disturbances -homatropine; hydrocodone -naproxen -narcotic medicines (opiates) for pain -phenothiazines like chlorpromazine, mesoridazine, prochlorperazine, thioridazine This list may not describe all possible interactions. Give your health care provider a list of all the medicines, herbs, non-prescription drugs, or dietary supplements you use.  Also tell them if you smoke, drink alcohol, or use illegal drugs. Some items may interact with your medicine. What should I watch for while using this medicine? Visit your doctor or health care professional for regular checks on your progress. You may want to keep a record at home of how you feel your condition is responding to treatment. You may  want to share this information with your doctor or health care professional at each visit. You should contact your doctor or health care professional if your seizures get worse or if you have any new types of seizures. Do not stop taking this medicine or any of your seizure medicines unless instructed by your doctor or health care professional. Stopping your medicine suddenly can increase your seizures or their severity. Wear a medical identification bracelet or chain if you are taking this medicine for seizures, and carry a card that lists all your medications. You may get drowsy, dizzy, or have blurred vision. Do not drive, use machinery, or do anything that needs mental alertness until you know how this medicine affects you. To reduce dizzy or fainting spells, do not sit or stand up quickly, especially if you are an older patient. Alcohol can increase drowsiness and dizziness. Avoid alcoholic drinks. Your mouth may get dry. Chewing sugarless gum or sucking hard candy, and drinking plenty of water will help. The use of this medicine may increase the chance of suicidal thoughts or actions. Pay special attention to how you are responding while on this medicine. Any worsening of mood, or thoughts of suicide or dying should be reported to your health care professional right away. Women who become pregnant while using this medicine may enroll in the [place] Pregnancy Registry by calling [phone]. This registry collects information about the safety of antiepileptic drug use during pregnancy. What side effects may I notice from receiving this medicine? Side effects that you should report to your doctor or health care professional as soon as possible: -allergic reactions like skin rash, itching or hives, swelling of the face, lips, or tongue -worsening of mood, thoughts or actions of suicide or dying Side effects that usually do not require medical attention (report to your doctor  or health care professional if they continue or are bothersome): -constipation -difficulty walking or controlling muscle movements -dizziness -nausea -slurred speech -tiredness -tremors -weight gain This list may not describe all possible side effects. Call your doctor for medical advice about side effects. You may report side effects to FDA at 1-800-FDA-1088. Where should I keep my medicine? Keep out of reach of children. This medicine may cause accidental overdose and death if it taken by other adults, children, or pets. Mix any unused medicine with a substance like cat litter or coffee grounds. Then throw the medicine away in a sealed container like a sealed bag or a coffee can with a lid. Do not use the medicine after the expiration date. Store at room temperature between 15 and 30 degrees C (59 and 86 degrees F). NOTE: This sheet is a summary. It may not cover all possible information. If you have questions about this medicine, talk to your doctor, pharmacist, or health care provider.  2018 Elsevier/Gold Standard (2013-06-03 15:26:50)

## 2016-08-21 ENCOUNTER — Ambulatory Visit (INDEPENDENT_AMBULATORY_CARE_PROVIDER_SITE_OTHER): Payer: PRIVATE HEALTH INSURANCE | Admitting: Family Medicine

## 2016-08-21 ENCOUNTER — Encounter: Payer: Self-pay | Admitting: Family Medicine

## 2016-08-21 ENCOUNTER — Other Ambulatory Visit (HOSPITAL_COMMUNITY)
Admission: RE | Admit: 2016-08-21 | Discharge: 2016-08-21 | Disposition: A | Discharge status: Home / Self Care | Payer: PRIVATE HEALTH INSURANCE | Source: Ambulatory Visit | Attending: Family Medicine | Admitting: Family Medicine

## 2016-08-21 VITALS — BP 122/74 | Temp 98.1°F | Ht 75.0 in | Wt 271.0 lb

## 2016-08-21 DIAGNOSIS — K625 Hemorrhage of anus and rectum: Secondary | ICD-10-CM | POA: Insufficient documentation

## 2016-08-21 LAB — CBC WITH DIFFERENTIAL/PLATELET
BASOS ABS: 0 10*3/uL (ref 0.0–0.1)
BASOS PCT: 1 %
EOS PCT: 2 %
Eosinophils Absolute: 0.1 10*3/uL (ref 0.0–0.7)
HCT: 35.9 % — ABNORMAL LOW (ref 39.0–52.0)
Hemoglobin: 12.1 g/dL — ABNORMAL LOW (ref 13.0–17.0)
Lymphocytes Relative: 40 %
Lymphs Abs: 1.9 10*3/uL (ref 0.7–4.0)
MCH: 30.3 pg (ref 26.0–34.0)
MCHC: 33.7 g/dL (ref 30.0–36.0)
MCV: 90 fL (ref 78.0–100.0)
MONO ABS: 0.3 10*3/uL (ref 0.1–1.0)
MONOS PCT: 5 %
Neutro Abs: 2.4 10*3/uL (ref 1.7–7.7)
Neutrophils Relative %: 52 %
PLATELETS: 177 10*3/uL (ref 150–400)
RBC: 3.99 MIL/uL — ABNORMAL LOW (ref 4.22–5.81)
RDW: 15.5 % (ref 11.5–15.5)
WBC: 4.7 10*3/uL (ref 4.0–10.5)

## 2016-08-21 LAB — IRON AND TIBC
Iron: 55 ug/dL (ref 45–182)
SATURATION RATIOS: 20 % (ref 17.9–39.5)
TIBC: 269 ug/dL (ref 250–450)
UIBC: 214 ug/dL

## 2016-08-21 LAB — POCT HEMOGLOBIN: Hemoglobin: 7.6 g/dL — AB (ref 14.1–18.1)

## 2016-08-21 LAB — FERRITIN: Ferritin: 29 ng/mL (ref 24–336)

## 2016-08-21 NOTE — Progress Notes (Signed)
   Subjective:    Patient ID: [name] [name], male    DOB: [birth year], 61 y.o.   MRN: [id number]  HPIBlood in stools. Started one week ago.  Patient states had large amount of blood in the stool on Friday a little bit less on Saturday Sunday because it's persisting he comes in denies abdominal pain denies fever chills sweats he is up-to-date on colonoscopy was completed 3 years ago by Dr. Oneida Alar. No history of any cancer no history of bleeding issues has a history diverticula   Review of Systems Denies vomiting weight loss fever chills sweats abdominal pain    Objective:   Physical Exam  Lungs clear heart regular pulse normal abdomen soft rectal exam shows hematochezia positive Hemoccult      Assessment & Plan:  Hematochezia Stat lab work ordered Await the results We will connect with gastroenterology may well need colonoscopy

## 2016-08-22 ENCOUNTER — Telehealth: Payer: Self-pay | Admitting: Family Medicine

## 2016-08-22 NOTE — Telephone Encounter (Signed)
Left message to return call 

## 2016-08-22 NOTE — Telephone Encounter (Signed)
Please let the patient know that I did discuss the case with gastroenterology. Dr. Oneida Alar was not in today but spoke with partner.Morton Peters NP-they will be getting the patient into the office this coming week. They will be calling him to give him the appointment. If the patient has severe bleeding he may need to go to the ER but the chance of this happening would be slim-if the patient has not heard from gastroenterology by Tuesday have the patient notify us

## 2016-08-22 NOTE — Telephone Encounter (Signed)
Dr Nicki Reaper stated that he spoke with the patient and notified him that GI will be contacting him with an appointment.

## 2016-08-22 NOTE — Telephone Encounter (Signed)
PA from Dr. Oneida Alar office to call Dr.Scott in regards to this patient.

## 2016-08-25 ENCOUNTER — Other Ambulatory Visit: Payer: Self-pay | Admitting: Family Medicine

## 2016-08-25 LAB — HEPATITIS C ANTIBODY: Hep C Virus Ab: <0.1 s/co ratio (ref 0.0–0.9)

## 2016-08-25 LAB — HEMOGLOBIN A1C
Est. average glucose Bld gHb Est-mCnc: 134 mg/dL
Hgb A1c MFr Bld: 6.3 % — ABNORMAL HIGH (ref 4.8–5.6)

## 2016-08-25 LAB — MULTIPLE MYELOMA PANEL, SERUM
ALBUMIN SERPL ELPH-MCNC: 3.5 g/dL (ref 2.9–4.4)
ALPHA 1: 0.2 g/dL (ref 0.0–0.4)
Albumin/Glob SerPl: 1.3 (ref 0.7–1.7)
Alpha2 Glob SerPl Elph-Mcnc: 0.6 g/dL (ref 0.4–1.0)
B-Globulin SerPl Elph-Mcnc: 0.9 g/dL (ref 0.7–1.3)
Gamma Glob SerPl Elph-Mcnc: 1 g/dL (ref 0.4–1.8)
Globulin, Total: 2.7 g/dL (ref 2.2–3.9)
IGA/IMMUNOGLOBULIN A, SERUM: 253 mg/dL (ref 61–437)
IGM (IMMUNOGLOBULIN M), SRM: 32 mg/dL (ref 20–172)
IgG (Immunoglobin G), Serum: 979 mg/dL (ref 700–1600)
TOTAL PROTEIN: 6.2 g/dL (ref 6.0–8.5)

## 2016-08-25 LAB — HEAVY METALS, BLOOD
ARSENIC: 5 ug/L (ref 2–23)
LEAD, BLOOD: 1 ug/dL (ref 0–19)
MERCURY: NOT DETECTED ug/L (ref 0.0–14.9)

## 2016-08-25 LAB — PAN-ANCA
ANCA Proteinase 3: <3.5 U/mL (ref 0.0–3.5)
Atypical pANCA: <1:20 {titer}
C-ANCA: <1:20 {titer}

## 2016-08-25 LAB — ANA W/REFLEX: ANA: NEGATIVE

## 2016-08-25 LAB — SEDIMENTATION RATE: SED RATE: 2 mm/h (ref 0–30)

## 2016-08-25 LAB — HIV ANTIBODY (ROUTINE TESTING W REFLEX): HIV SCREEN 4TH GENERATION: NONREACTIVE

## 2016-08-25 LAB — VITAMIN B1: Thiamine: 137.5 nmol/L (ref 66.5–200.0)

## 2016-08-25 LAB — B. BURGDORFI ANTIBODIES: Lyme IgG/IgM Ab: <0.91 {ISR} (ref 0.00–0.90)

## 2016-08-25 LAB — SJOGREN'S SYNDROME ANTIBODS(SSA + SSB): ENA SSA (RO) Ab: <0.2 AI (ref 0.0–0.9)

## 2016-08-25 LAB — RHEUMATOID FACTOR

## 2016-08-25 LAB — ANGIOTENSIN CONVERTING ENZYME: Angio Convert Enzyme: 35 U/L (ref 14–82)

## 2016-08-25 LAB — VITAMIN B6: VITAMIN B6: 31.9 ug/L (ref 5.3–46.7)

## 2016-08-26 ENCOUNTER — Encounter: Payer: Self-pay | Admitting: Family Medicine

## 2016-08-26 ENCOUNTER — Telehealth: Payer: Self-pay

## 2016-08-26 DIAGNOSIS — R7303 Prediabetes: Secondary | ICD-10-CM

## 2016-08-26 NOTE — Telephone Encounter (Signed)
-----   Message from Melvenia Beam, MD sent at 08/25/2016  8:21 PM EDT ----- Labs are normal except for HgbA1c. He is very close to being diabetic. Even pre-diabetes can cause of his small-fiber neuropathy and he should return to Dr. Wolfgang Phoenix for management. May suggest starting metformin. Also start alpha-lipoic acid, 400-600mg  daily as this has been shown to improve diabetic neuropathy.

## 2016-08-26 NOTE — Telephone Encounter (Signed)
Called pt w/ lab results and recommendations. May call back w/ additional questions/concerns.

## 2016-08-26 NOTE — Telephone Encounter (Signed)
Pt returned call. Reviewed results and recommendations. Verbalized understanding and appreciation for call.

## 2016-08-27 NOTE — Telephone Encounter (Signed)
Please let the patient know that I am aware of what the neurologist did and her testing. His hemoglobin A1c is in the prediabetes phase. The patient would benefit from having a glucometer to periodically check fasting sugars it would be fine to give him a prescription for glucometer with strips as well as papers for him to file his glucose numbers on. I will be more than happy to sit down with the patient and spend the office visit with him discussing this if you would like to schedule an office visit otherwise I recommend the following. I would recommend diabetic education consultation. The first step in treatment of prediabetes is dietary education, following a healthy diabetic diet, regular physical activity including cardio, weight control. Because of his renal function being mildly elevated certain medications that are you used in this situation will not be possible. Before putting him on other medicines he needs to do step 1 therapy which is diabetic education, healthy diabetic diet, and weight control. We will recheck a hemoglobin A1c 3 months-please set him up a follow-up visit for that.

## 2016-08-28 ENCOUNTER — Encounter: Payer: Self-pay | Admitting: Neurology

## 2016-08-28 NOTE — Addendum Note (Signed)
Addended by: Dairl Ponder on: 08/28/2016 09:29 AM   Modules accepted: Orders

## 2016-08-29 ENCOUNTER — Other Ambulatory Visit: Payer: Self-pay

## 2016-08-29 ENCOUNTER — Ambulatory Visit (INDEPENDENT_AMBULATORY_CARE_PROVIDER_SITE_OTHER): Payer: PRIVATE HEALTH INSURANCE | Admitting: Nurse Practitioner

## 2016-08-29 ENCOUNTER — Encounter: Payer: Self-pay | Admitting: Nurse Practitioner

## 2016-08-29 DIAGNOSIS — K625 Hemorrhage of anus and rectum: Secondary | ICD-10-CM | POA: Diagnosis not present

## 2016-08-29 MED ORDER — PEG 3350-KCL-NA BICARB-NACL 420 G PO SOLR: 4000.0000 mL | ORAL | 0 refills | Status: DC

## 2016-08-29 NOTE — Progress Notes (Signed)
Primary Care Physician:  Kathyrn Drown, MD Primary Gastroenterologist:  Dr. Oneida Alar  Chief Complaint  Patient presents with  . Blood In Stools    x1 week, stopped 2 days ago    HPI:   [name] [name] is a 61 y.o. male who presents on referral from primary care for urgent office visit for rectal bleeding. The patient was last seen by primary care on 08/21/2016 for blood in stools which started the week prior. The patient noted a large amount of blood over the course of one to 2 days which slowly decreased in amount. No associated abdominal pain, fever, chills. Up-to-date with colonoscopy completed 3 years prior. No history of bleeding, does have a history of diverticula.  Labs completed on Aug 21 2016 include CBC which was negative for leukocytosis, mild drop in hemoglobin to 12.1, normal iron and saturation, normal ferritin at 29. The patient was referred to GI for further evaluation.  Previous colonoscopy completed 03/08/2014 for high risk personal history of colon polyps. Findings include a single polyp measuring 3 mm in the sigmoid colon which was unable to be retrieved, moderate diverticulosis throughout the entire examined colon, moderate size hemorrhoids. Recommended repeat colonoscopy in 5 years (2020), high-fiber diet.  Today he states he's doing well overall. About 2-3 weeks ago went to the bathroom he had a large amount of blood which slowly declined in amout and tapered off/stopped about 2-3 days ago. Denies abdominal pain, N/V, unintentional weight loss, fever, chills, acute changes in bowel habits/consistency/caliber. Denies GERD symptoms, NSAIDs, ASA powders. Denies hemorrhoid symptoms. Denies chest pain, dyspnea, dizziness, lightheadedness, syncope, near syncope. Denies any other upper or lower GI symptoms.  Past Medical History:  Diagnosis Date  . Adenomatous colon polyp 2007 [place]  . DDD (degenerative disc disease)   . GERD (gastroesophageal reflux disease)   . Herniated  disc    Back  . HTN (hypertension)   . Hyperlipemia   . Prostate CA [facility]) 2004    Past Surgical History:  Procedure Laterality Date  . COLONOSCOPY  11/15/2010   Dr. Fields:Pedunculated polyp/pan-colonic  TICS/mod IH. tubular adenoma  . COLONOSCOPY  2007   Dr. Oneida Alar: 3 mm tubular adeoma  . COLONOSCOPY N/A 03/08/2014   Procedure: COLONOSCOPY;  Surgeon: Danie Binder, MD;  [facility];  Service: Endoscopy;  Laterality: N/A;  100 - moved to 12:45 - Ginger to notify pt  . KNEE ARTHROSCOPY W/ MENISCECTOMY  2011 RIGHT  . LAMINECTOMY AND MICRODISCECTOMY LUMBAR SPINE  2002  . PROSTATECTOMY      Current Outpatient Prescriptions  Medication Sig Dispense Refill  . aspirin 81 MG tablet Take 81 mg by mouth daily.    . B Complex Vitamins (B COMPLEX PO) Take by mouth daily.     Marland Kitchen b complex vitamins capsule Take 1 capsule by mouth daily.    . cetirizine (ZYRTEC) 10 MG tablet TAKE ONE (1) TABLET BY MOUTH EVERY DAY 30 tablet 0  . Cholecalciferol (VITAMIN D PO) Take by mouth daily. Patient unsure of dose    . CRESTOR 20 MG tablet TAKE ONE (1) TABLET EACH DAY 30 tablet 5  . doxazosin (CARDURA) 4 MG tablet TAKE ONE TABLET BY MOUTH EVERY NIGHT AT BEDTIME 30 tablet 0  . gabapentin (NEURONTIN) 300 MG capsule Take 1 capsule (300 mg total) by mouth 3 (three) times daily. 90 capsule 11  . HM OMEPRAZOLE 20 MG TBEC TAKE ONE TABLET ONCE DAILY 28 each 0  .  indapamide (LOZOL) 2.5 MG tablet TAKE ONE (1) TABLET EACH DAY 30 tablet 3  . Loratadine (AF ALLERGY RELIEF PO) Take by mouth.    . losartan (COZAAR) 100 MG tablet TAKE ONE (1) TABLET BY MOUTH EVERY DAY 30 tablet 3  . metoprolol succinate (TOPROL-XL) 50 MG 24 hr tablet TAKE ONE (1) TABLET EACH DAY 30 tablet 5  . Multiple Vitamin (MULTIVITAMIN) capsule Take 1 capsule by mouth daily.      Marland Kitchen NIFEdipine (PROCARDIA XL/ADALAT-CC) 90 MG 24 hr tablet TAKE ONE (1) TABLET EACH DAY 30 tablet 5  . Omega-3 Fatty Acids (FISH OIL) 1200 MG CAPS Take by mouth  daily.    . potassium chloride SA (K-DUR,KLOR-CON) 20 MEQ tablet TAKE ONE TABLET IN THE MORNING AND TAKE TWO TABLETS EVERY EVENING 90 tablet 5  . predniSONE (DELTASONE) 10 MG tablet Take 1-2 tablets every 4-6 hours as needed 60 tablet 5  . sildenafil (REVATIO) 20 MG tablet Take 20 mg by mouth 3 (three) times daily.    Marland Kitchen triamcinolone cream (KENALOG) 0.1 % APPLY TO AFFECTED AREA(S) 2 TIMES A DAY 30 g 12   No current facility-administered medications for this visit.     Allergies as of 08/29/2016  . (No Known Allergies)    Family History  Problem Relation Age of Onset  . Colon cancer Neg Hx   . Colon polyps Neg Hx   . Neuropathy Neg Hx     Social History   Social History  . Marital status: Married    Spouse name: N/A  . Number of children: N/A  . Years of education: N/A   Occupational History  .  [employer] History Main Topics  . Smoking status: Never Smoker  . Smokeless tobacco: Never Used  . Alcohol use No  . Drug use: No  . Sexual activity: Not on file   Other Topics Concern  . Not on file   Social History Narrative  . No narrative on file    Review of Systems: Complete ROS negative except as per HPI.    Physical Exam: BP 135/76   Pulse 75   Temp (!) 96.8 F (36 C) (Oral)   Ht 6\' 3"  (1.905 m)   Wt 267 lb 3.2 oz (121.2 kg)   BMI 33.40 kg/m  General:   Alert and oriented. Pleasant and cooperative. Well-nourished and well-developed.  Head:  Normocephalic and atraumatic. Eyes:  Without icterus, sclera clear and conjunctiva pink.  Ears:  Normal auditory acuity. Cardiovascular:  S1, S2 present without murmurs appreciated. Extremities without clubbing or edema. Respiratory:  Clear to auscultation bilaterally. No wheezes, rales, or rhonchi. No distress.  Gastrointestinal:  +BS, rounded but soft, non-tender and non-distended. No HSM noted. No guarding or rebound. No masses appreciated.  Rectal:  Deferred  Musculoskalatal:   Symmetrical without gross deformities. Neurologic:  Alert and oriented x4;  grossly normal neurologically. Psych:  Alert and cooperative. Normal mood and affect. Heme/Lymph/Immune: No excessive bruising noted.    08/29/2016 11:06 AM   Disclaimer: This note was dictated with voice recognition software. Similar sounding words can inadvertently be transcribed and may not be corrected upon review.

## 2016-08-29 NOTE — Patient Instructions (Signed)
1. We will schedule your colonoscopy for you. 2. Have your blood work drawn in about 1 week. 3. If you have any worsening bleeding, recurrent bleeding, especially bleeding that occurs with chest pain, shortness of breath, dizziness, lightheadedness, passing out or feeling like you're going to pass out proceed to the emergency room. 4. Call us if any worsening or recurrent symptoms. 5. Call with any questions or concerns. 6. Further recommendations will be made after your procedure. 7. Return for follow-up based on recommendations made after your colonoscopy.

## 2016-08-29 NOTE — Assessment & Plan Note (Signed)
The patient is a high risk due to personal history of colon polyps. Last colonoscopy 3 years ago with moderate internal hemorrhoids, pan colonic diverticula, and a single colon polyp unable to be retrieved. Recommended 5 year repeat colonoscopy. The patient had a large amount of hematochezia about 2 weeks ago which slowly decreased in amount and eventually tapered off and stopped about 2 or 3 days ago. He is seen no further bleeding since. His hemoglobin was a bit low at 12.1 with a baseline of 13 for an approximate 1 g drop. His iron stores were normal as were his red blood cell indices supporting acute, noniron deficient bleeding. He is not having any hemorrhoid symptoms around his bleeding. Denies constipation. Likely self-limited diverticular bleed but differentials include hemorrhoid bleeding, polyp, less likely colorectal cancer. At this point we'll proceed with an early interval colonoscopy to further evaluate given his high risk status, acute onset bleed.  I will also recheck a CBC in a week to ensure no further declined. ER precautions given as well.  Proceed with colonoscopy with Dr. Oneida Alar in the near future. The risks, benefits, and alternatives have been discussed in detail with the patient. They state understanding and desire to proceed.   The patient is not on any anticoagulants, anxiolytics, chronic pain medications, or antidepressants. Denies drug and alcohol use. Conscious sedation should be adequate for his procedure as it was for his last.

## 2016-08-29 NOTE — Progress Notes (Signed)
cc'ed to pcp °

## 2016-09-01 ENCOUNTER — Encounter: Payer: Self-pay | Admitting: Family Medicine

## 2016-09-01 ENCOUNTER — Other Ambulatory Visit: Payer: Self-pay

## 2016-09-01 DIAGNOSIS — K625 Hemorrhage of anus and rectum: Secondary | ICD-10-CM

## 2016-09-07 ENCOUNTER — Ambulatory Visit
Admission: RE | Admit: 2016-09-07 | Discharge: 2016-09-07 | Disposition: A | Discharge status: Home / Self Care | Payer: PRIVATE HEALTH INSURANCE | Source: Ambulatory Visit | Attending: Neurology | Admitting: Neurology

## 2016-09-07 DIAGNOSIS — G609 Hereditary and idiopathic neuropathy, unspecified: Secondary | ICD-10-CM

## 2016-09-07 DIAGNOSIS — M5416 Radiculopathy, lumbar region: Secondary | ICD-10-CM | POA: Diagnosis not present

## 2016-09-07 DIAGNOSIS — R202 Paresthesia of skin: Secondary | ICD-10-CM

## 2016-09-08 ENCOUNTER — Telehealth: Payer: Self-pay

## 2016-09-08 NOTE — Telephone Encounter (Signed)
Called [facility] to see if pt needs PA for upcoming colonoscopy. LMOVM for return call.

## 2016-09-09 NOTE — Telephone Encounter (Signed)
[facility] called. No PA needed for Colonoscopy. Ref# [id number].

## 2016-09-11 ENCOUNTER — Telehealth: Payer: Self-pay | Admitting: *Deleted

## 2016-09-11 NOTE — Telephone Encounter (Signed)
LVM on home phone informing patient that his MRI lumbar spine showed he has some degenerative/diffuse arthirtic changes but no pinched nerves. Left number for any questions.

## 2016-09-25 ENCOUNTER — Other Ambulatory Visit: Payer: Self-pay | Admitting: Family Medicine

## 2016-10-14 ENCOUNTER — Ambulatory Visit (INDEPENDENT_AMBULATORY_CARE_PROVIDER_SITE_OTHER): Payer: PRIVATE HEALTH INSURANCE | Admitting: Urology

## 2016-10-14 ENCOUNTER — Telehealth: Payer: Self-pay | Admitting: Family Medicine

## 2016-10-14 DIAGNOSIS — R32 Unspecified urinary incontinence: Secondary | ICD-10-CM

## 2016-10-14 DIAGNOSIS — E876 Hypokalemia: Secondary | ICD-10-CM

## 2016-10-14 DIAGNOSIS — C61 Malignant neoplasm of prostate: Secondary | ICD-10-CM | POA: Diagnosis not present

## 2016-10-14 DIAGNOSIS — N5201 Erectile dysfunction due to arterial insufficiency: Secondary | ICD-10-CM

## 2016-10-14 NOTE — Telephone Encounter (Signed)
Patient dropped off blood work results for you to review.  See in red folder. °

## 2016-10-17 ENCOUNTER — Encounter (HOSPITAL_COMMUNITY): Payer: Self-pay | Admitting: *Deleted

## 2016-10-17 ENCOUNTER — Encounter (HOSPITAL_COMMUNITY)
Admission: RE | Disposition: A | Discharge status: Home / Self Care | Payer: Self-pay | Source: Ambulatory Visit | Attending: Gastroenterology

## 2016-10-17 ENCOUNTER — Ambulatory Visit (HOSPITAL_COMMUNITY)
Admission: RE | Admit: 2016-10-17 | Discharge: 2016-10-17 | Disposition: A | Discharge status: Home / Self Care | Payer: PRIVATE HEALTH INSURANCE | Source: Ambulatory Visit | Attending: Gastroenterology | Admitting: Gastroenterology

## 2016-10-17 DIAGNOSIS — D122 Benign neoplasm of ascending colon: Secondary | ICD-10-CM | POA: Insufficient documentation

## 2016-10-17 DIAGNOSIS — Z8546 Personal history of malignant neoplasm of prostate: Secondary | ICD-10-CM | POA: Diagnosis not present

## 2016-10-17 DIAGNOSIS — Z79899 Other long term (current) drug therapy: Secondary | ICD-10-CM | POA: Diagnosis not present

## 2016-10-17 DIAGNOSIS — D124 Benign neoplasm of descending colon: Secondary | ICD-10-CM | POA: Insufficient documentation

## 2016-10-17 DIAGNOSIS — K921 Melena: Secondary | ICD-10-CM | POA: Diagnosis not present

## 2016-10-17 DIAGNOSIS — Z7982 Long term (current) use of aspirin: Secondary | ICD-10-CM | POA: Insufficient documentation

## 2016-10-17 DIAGNOSIS — K644 Residual hemorrhoidal skin tags: Secondary | ICD-10-CM | POA: Insufficient documentation

## 2016-10-17 DIAGNOSIS — Z7952 Long term (current) use of systemic steroids: Secondary | ICD-10-CM | POA: Insufficient documentation

## 2016-10-17 DIAGNOSIS — E785 Hyperlipidemia, unspecified: Secondary | ICD-10-CM | POA: Diagnosis not present

## 2016-10-17 DIAGNOSIS — I10 Essential (primary) hypertension: Secondary | ICD-10-CM | POA: Insufficient documentation

## 2016-10-17 DIAGNOSIS — K648 Other hemorrhoids: Secondary | ICD-10-CM | POA: Insufficient documentation

## 2016-10-17 DIAGNOSIS — K625 Hemorrhage of anus and rectum: Secondary | ICD-10-CM

## 2016-10-17 DIAGNOSIS — K5730 Diverticulosis of large intestine without perforation or abscess without bleeding: Secondary | ICD-10-CM | POA: Diagnosis not present

## 2016-10-17 DIAGNOSIS — Z791 Long term (current) use of non-steroidal anti-inflammatories (NSAID): Secondary | ICD-10-CM | POA: Insufficient documentation

## 2016-10-17 DIAGNOSIS — D125 Benign neoplasm of sigmoid colon: Secondary | ICD-10-CM | POA: Diagnosis not present

## 2016-10-17 HISTORY — PX: COLONOSCOPY: SHX5424

## 2016-10-17 SURGERY — COLONOSCOPY
Anesthesia: Moderate Sedation

## 2016-10-17 MED ORDER — STERILE WATER FOR IRRIGATION IR SOLN
Status: DC | PRN
  Administered 2016-10-17: 2.5 mL

## 2016-10-17 MED ORDER — SODIUM CHLORIDE 0.9 % IV SOLN
INTRAVENOUS | Status: DC
  Administered 2016-10-17: 1000 mL via INTRAVENOUS

## 2016-10-17 MED ORDER — MEPERIDINE HCL 100 MG/ML IJ SOLN
INTRAMUSCULAR | Status: DC | PRN
  Administered 2016-10-17 (×3): 25 mg via INTRAVENOUS

## 2016-10-17 MED ORDER — MIDAZOLAM HCL 5 MG/5ML IJ SOLN
INTRAMUSCULAR | Status: DC | PRN
  Administered 2016-10-17 (×3): 2 mg via INTRAVENOUS

## 2016-10-17 MED ORDER — MIDAZOLAM HCL 5 MG/5ML IJ SOLN
INTRAMUSCULAR | Status: AC
  Filled 2016-10-17: qty 10

## 2016-10-17 MED ORDER — MEPERIDINE HCL 100 MG/ML IJ SOLN
INTRAMUSCULAR | Status: AC
  Filled 2016-10-17: qty 2

## 2016-10-17 NOTE — H&P (Addendum)
Primary Care Physician:  Kathyrn Drown, MD Primary Gastroenterologist:  Dr. Oneida Alar  Pre-Procedure History & Physical: HPI:  [name] [name] is a 61 y.o. male here for BRBPR.  Past Medical History:  Diagnosis Date  . Adenomatous colon polyp 2007 [place]  . DDD (degenerative disc disease)   . GERD (gastroesophageal reflux disease)   . Herniated disc    Back  . HTN (hypertension)   . Hyperlipemia   . Prostate CA [facility]) 2004    Past Surgical History:  Procedure Laterality Date  . COLONOSCOPY  11/15/2010   Dr. Fields:Pedunculated polyp/pan-colonic  TICS/mod IH. tubular adenoma  . COLONOSCOPY  2007   Dr. Oneida Alar: 3 mm tubular adeoma  . COLONOSCOPY N/A 03/08/2014   Procedure: COLONOSCOPY;  Surgeon: Danie Binder, MD;  [facility];  Service: Endoscopy;  Laterality: N/A;  100 - moved to 12:45 - Ginger to notify pt  . KNEE ARTHROSCOPY W/ MENISCECTOMY  2011 RIGHT  . LAMINECTOMY AND MICRODISCECTOMY LUMBAR SPINE  2002  . PROSTATECTOMY      Prior to Admission medications   Medication Sig Start Date End Date Taking? Authorizing Provider  aspirin 81 MG tablet Take 81 mg by mouth daily.   Yes [provider]  b complex vitamins capsule Take 1 capsule by mouth daily.   Yes [provider]  cetirizine (ZYRTEC) 10 MG tablet TAKE ONE (1) TABLET BY MOUTH EVERY DAY 11/26/15  Yes Luking, Elayne Snare, MD  Cholecalciferol (VITAMIN D PO) Take by mouth daily. Patient unsure of dose   Yes [provider]  CRESTOR 20 MG tablet TAKE ONE (1) TABLET EACH DAY 07/25/16  Yes Luking, Scott A, MD  doxazosin (CARDURA) 4 MG tablet TAKE ONE TABLET BY MOUTH EVERY NIGHT AT BEDTIME 09/25/16  Yes Luking, Scott A, MD  gabapentin (NEURONTIN) 300 MG capsule Take 1 capsule (300 mg total) by mouth 3 (three) times daily. 08/20/16  Yes Melvenia Beam, MD  HM OMEPRAZOLE 20 MG TBEC TAKE ONE TABLET ONCE DAILY 09/25/16  Yes Kathyrn Drown, MD  indapamide (LOZOL) 2.5 MG tablet TAKE ONE (1) TABLET EACH DAY  09/25/16  Yes Luking, Scott A, MD  losartan (COZAAR) 100 MG tablet TAKE ONE (1) TABLET BY MOUTH EVERY DAY 09/25/16  Yes Luking, Scott A, MD  metoprolol succinate (TOPROL-XL) 50 MG 24 hr tablet TAKE ONE (1) TABLET EACH DAY 07/25/16  Yes Kathyrn Drown, MD  Multiple Vitamin (MULTIVITAMIN) capsule Take 1 capsule by mouth daily.     Yes [provider]  NIFEdipine (PROCARDIA XL/ADALAT-CC) 90 MG 24 hr tablet TAKE ONE (1) TABLET EACH DAY 07/25/16  Yes Luking, Elayne Snare, MD  Omega-3 Fatty Acids (FISH OIL) 1200 MG CAPS Take by mouth daily.   Yes [provider]  polyethylene glycol-electrolytes (TRILYTE) 420 g solution Take 4,000 mLs by mouth as directed. Patient taking differently: Take 4,000 mLs by mouth as directed. Will take prior to porocedure 08/29/16  Yes Fields, Sandi L, MD  potassium chloride SA (K-DUR,KLOR-CON) 20 MEQ tablet TAKE ONE TABLET IN THE MORNING AND TAKE TWO TABLETS EVERY EVENING 07/25/16  Yes Luking, Elayne Snare, MD  predniSONE (DELTASONE) 10 MG tablet Take 1-2 tablets every 4-6 hours as needed Patient taking differently: Take 10 mg by mouth daily as needed (heel pain).  04/01/16  Yes Carole Civil, MD  HM OMEPRAZOLE 20 MG TBEC TAKE ONE TABLET ONCE DAILY Patient not taking: Reported on 10/14/2016 08/26/16   Kathyrn Drown, MD  triamcinolone cream (KENALOG) 0.1 % APPLY TO AFFECTED AREA(S) 2 TIMES A DAY Patient not taking: Reported on 10/14/2016 05/28/16   Kathyrn Drown, MD    Allergies as of 09/01/2016  . (No Known Allergies)    Family History  Problem Relation Age of Onset  . Heart Problems Mother   . Aneurysm Brother   . Colon cancer Neg Hx   . Colon polyps Neg Hx   . Neuropathy Neg Hx     Social History   Social History  . Marital status: Married    Spouse name: N/A  . Number of children: N/A  . Years of education: N/A   Occupational History  .  [place] History Main Topics  . Smoking status: Never Smoker  .  Smokeless tobacco: Never Used  . Alcohol use No  . Drug use: No  . Sexual activity: Not on file   Other Topics Concern  . Not on file   Social History Narrative  . No narrative on file    Review of Systems: See HPI, otherwise negative ROS   Physical Exam: BP 139/83   Pulse (!) 53   Temp 97.6 F (36.4 C) (Oral)   Resp 16   Ht 6\' 3"  (1.905 m)   Wt 258 lb (117 kg)   SpO2 97%   BMI 32.25 kg/m   General:   Alert,  pleasant and cooperative in NAD Head:  Normocephalic and atraumatic. Neck:  Supple; Lungs:  Clear throughout to auscultation.    Heart:  Regular rate and rhythm. Abdomen:  Soft, nontender and nondistended. Normal bowel sounds, without guarding, and without rebound.   Neurologic:  Alert and  oriented x4;  grossly normal neurologically.  Impression/Plan:    BRBPR  PLAN: TCS TODAY. DISCUSSED PROCEDURE, BENEFITS, & RISKS: < 1% chance of medication reaction, bleeding, perforation, or rupture of spleen/liver.

## 2016-10-17 NOTE — Discharge Instructions (Signed)
You have DIVERTICULOSIS IN YOUR RIGHT AND LEFT COLON AND THE LEFT COLON IS THE SOURCE OF YOUR RECTAL BLEEDING. THIS IS MORE LIKELY TO HAPPEN IF YOU TAKE ASPIRIN OR ANTI-INFLAMMATORY DRUGS. YOU THREE SMALL POLYPS REMOVED. THE LAST PART OF YOUR SMALL BOWEL IS NORMAL.   NO ASPIRIN, BC/GOODY POWDERS, IBUPROFEN/MOTRIN, OR NAPROXEN/ALEVE UNLESS MEDICALLY NECESSARY.  DRINK WATER TO KEEP YOUR URINE LIGHT YELLOW.  FOLLOW A HIGH FIBER DIET. AVOID ITEMS THAT CAUSE BLOATING. See info below.  CONTINUE YOUR WEIGHT LOSS EFFORTS.  WHILE I DO NOT WANT TO ALARM YOU, YOUR BODY MASS INDEX IS OVER 30. YOU SHOULD TRY TO GET IT UNDER 30. A WEIGHT OF 235 LBS WOULD GET YOUR BODY MASS INDEX UNDER 30. CAONSIDER A PLANT BASED DIET-NO MEAT OR DAIRY. AVOID ITEMS THAT CAUSE BLOATING & GAS. I RECOMMEND THE BOOK, "PREVENT AND REVERSE HEART DISEASE", CALDWELL ESSELSTYN JR., MD. PAGE 120-121 CLEARLY STATE THE RULES AND QUICK AND EASY RECIPES FOR BREAKFAST, LUNCH, AND DINNER ARE AFTER P 127.   USE PREPARATION H FOUR TIMES  A DAY IF NEEDED TO RELIEVE RECTAL PAIN/PRESSURE/BLEEDING.  YOUR BIOPSY RESULTS WILL BE AVAILABLE IN MY CHART  JUN JUL 4 AND MY OFFICE WILL CONTACT YOU IN 10-14 DAYS WITH YOUR RESULTS.   Next colonoscopy in 3-5 years.  Colonoscopy Care After Read the instructions outlined below and refer to this sheet in the next week. These discharge instructions provide you with general information on caring for yourself after you leave the hospital. While your treatment has been planned according to the most current medical practices available, unavoidable complications occasionally occur. If you have any problems or questions after discharge, call DR. Zyanna Leisinger, [phone].  ACTIVITY  You may resume your regular activity, but move at a slower pace for the next 24 hours.   Take frequent rest periods for the next 24 hours.   Walking will help get rid of the air and reduce the bloated feeling in your belly (abdomen).    No driving for 24 hours (because of the medicine (anesthesia) used during the test).   You may shower.   Do not sign any important legal documents or operate any machinery for 24 hours (because of the anesthesia used during the test).    NUTRITION  Drink plenty of fluids.   You may resume your normal diet as instructed by your doctor.   Begin with a light meal and progress to your normal diet. Heavy or fried foods are harder to digest and may make you feel sick to your stomach (nauseated).   Avoid alcoholic beverages for 24 hours or as instructed.    MEDICATIONS  You may resume your normal medications.   WHAT YOU CAN EXPECT TODAY  Some feelings of bloating in the abdomen.   Passage of more gas than usual.   Spotting of blood in your stool or on the toilet paper  .  IF YOU HAD POLYPS REMOVED DURING THE COLONOSCOPY:  Eat a soft diet IF YOU HAVE NAUSEA, BLOATING, ABDOMINAL PAIN, OR VOMITING.    FINDING OUT THE RESULTS OF YOUR TEST Not all test results are available during your visit. DR. Oneida Alar WILL CALL YOU WITHIN 14 DAYS OF YOUR PROCEDUE WITH YOUR RESULTS. Do not assume everything is normal if you have not heard from DR. Omie Ferger, CALL HER OFFICE AT [phone].  SEEK IMMEDIATE MEDICAL ATTENTION AND CALL THE OFFICE: [phone] IF:  You have more than a spotting of blood in your stool.   Your belly is  swollen (abdominal distention).   You are nauseated or vomiting.   You have a temperature over 101F.   You have abdominal pain or discomfort that is severe or gets worse throughout the day.  High-Fiber Diet A high-fiber diet changes your normal diet to include more whole grains, legumes, fruits, and vegetables. Changes in the diet involve replacing refined carbohydrates with unrefined foods. The calorie level of the diet is essentially unchanged. The Dietary Reference Intake (recommended amount) for adult males is 38 grams per day. For adult females, it is 25  grams per day. Pregnant and lactating women should consume 28 grams of fiber per day. Fiber is the intact part of a plant that is not broken down during digestion. Functional fiber is fiber that has been isolated from the plant to provide a beneficial effect in the body. PURPOSE  Increase stool bulk.   Ease and regulate bowel movements.   Lower cholesterol.  REDUCE RISK OF COLON CANCER  INDICATIONS THAT YOU NEED MORE FIBER  Constipation and hemorrhoids.   Uncomplicated diverticulosis (intestine condition) and irritable bowel syndrome.   Weight management.   As a protective measure against hardening of the arteries (atherosclerosis), diabetes, and cancer.   GUIDELINES FOR INCREASING FIBER IN THE DIET  Start adding fiber to the diet slowly. A gradual increase of about 5 more grams (2 slices of whole-wheat bread, 2 servings of most fruits or vegetables, or 1 bowl of high-fiber cereal) per day is best. Too rapid an increase in fiber may result in constipation, flatulence, and bloating.   Drink enough water and fluids to keep your urine clear or pale yellow. Water, juice, or caffeine-free drinks are recommended. Not drinking enough fluid may cause constipation.   Eat a variety of high-fiber foods rather than one type of fiber.   Try to increase your intake of fiber through using high-fiber foods rather than fiber pills or supplements that contain small amounts of fiber.   The goal is to change the types of food eaten. Do not supplement your present diet with high-fiber foods, but replace foods in your present diet.   INCLUDE A VARIETY OF FIBER SOURCES  Replace refined and processed grains with whole grains, canned fruits with fresh fruits, and incorporate other fiber sources. White rice, white breads, and most bakery goods contain little or no fiber.   Brown whole-grain rice, buckwheat oats, and many fruits and vegetables are all good sources of fiber. These include: broccoli,  Brussels sprouts, cabbage, cauliflower, beets, sweet potatoes, white potatoes (skin on), carrots, tomatoes, eggplant, squash, berries, fresh fruits, and dried fruits.   Cereals appear to be the richest source of fiber. Cereal fiber is found in whole grains and bran. Bran is the fiber-rich outer coat of cereal grain, which is largely removed in refining. In whole-grain cereals, the bran remains. In breakfast cereals, the largest amount of fiber is found in those with "bran" in their names. The fiber content is sometimes indicated on the label.   You may need to include additional fruits and vegetables each day.   In baking, for 1 cup white flour, you may use the following substitutions:   1 cup whole-wheat flour minus 2 tablespoons.   1/2 cup white flour plus 1/2 cup whole-wheat flour.   Polyps, Colon  A polyp is extra tissue that grows inside your body. Colon polyps grow in the large intestine. The large intestine, also called the colon, is part of your digestive system. It is a long, hollow  tube at the end of your digestive tract where your body makes and stores stool. Most polyps are not dangerous. They are benign. This means they are not cancerous. But over time, some types of polyps can turn into cancer. Polyps that are smaller than a pea are usually not harmful. But larger polyps could someday become or may already be cancerous. To be safe, doctors remove all polyps and test them.   PREVENTION There is not one sure way to prevent polyps. You might be able to lower your risk of getting them if you:  Eat more fruits and vegetables and less fatty food.   Do not smoke.   Avoid alcohol.   Exercise every day.   Lose weight if you are overweight.   Eating more calcium and folate can also lower your risk of getting polyps. Some foods that are rich in calcium are milk, cheese, and broccoli. Some foods that are rich in folate are chickpeas, kidney beans, and spinach.     Diverticulosis Diverticulosis is a common condition that develops when small pouches (diverticula) form in the wall of the colon. The risk of diverticulosis increases with age. It happens more often in people who eat a low-fiber diet. Most individuals with diverticulosis have no symptoms. Those individuals with symptoms usually experience belly (abdominal) pain, constipation, or loose stools (diarrhea).  HOME CARE INSTRUCTIONS  Increase the amount of fiber in your diet as directed by your caregiver or dietician. This may reduce symptoms of diverticulosis.   Drink at least 6 to 8 glasses of water each day to prevent constipation.   Try not to strain when you have a bowel movement.   Avoiding nuts and seeds to prevent complications is NOT NECESSARY.     FOODS HAVING HIGH FIBER CONTENT INCLUDE:  Fruits. Apple, peach, pear, tangerine, raisins, prunes.   Vegetables. Brussels sprouts, asparagus, broccoli, cabbage, carrot, cauliflower, romaine lettuce, spinach, summer squash, tomato, winter squash, zucchini.   Starchy Vegetables. Baked beans, kidney beans, lima beans, split peas, lentils, potatoes (with skin).   Grains. Whole wheat bread, brown rice, bran flake cereal, plain oatmeal, white rice, shredded wheat, bran muffins.    SEEK IMMEDIATE MEDICAL CARE IF:  You develop increasing pain or severe bloating.   You have an oral temperature above 101F.   You develop vomiting or bowel movements that are bloody or black.

## 2016-10-17 NOTE — Op Note (Addendum)
[facility] Patient Name: [name] [name] Procedure Date: 10/17/2016 9:14 AM MRN: [id number] Date of Birth: 20-Jul-1955 Attending MD: Barney Drain , MD CSN: [id number] Age: 61 Admit Type: Outpatient Procedure:                Colonoscopy with cold forceps polypectomy Indications:              Hematochezia LARGE VOLUME WHILE TAKING ASPIRIN &                            NAPROXEN. Providers:                Barney Drain, MD, Lurline Del, RN, Aram Candela Referring MD:             Elayne Snare Luking Medicines:                Midazolam 6 mg IV, Meperidine 75 mg IV Complications:            No immediate complications. Estimated Blood Loss:     Estimated blood loss was minimal. Procedure:                Pre-Anesthesia Assessment:                           - Prior to the procedure, a History and Physical                            was performed, and patient medications and                            allergies were reviewed. The patient's tolerance of                            previous anesthesia was also reviewed. The risks                            and benefits of the procedure and the sedation                            options and risks were discussed with the patient.                            All questions were answered, and informed consent                            was obtained. Prior Anticoagulants: The patient has                            taken aspirin, last dose was 1 day prior to                            procedure. ASA Grade Assessment: II - A patient                            with mild systemic disease. After reviewing the  risks and benefits, the patient was deemed in                            satisfactory condition to undergo the procedure.                            After obtaining informed consent, the colonoscope                            was passed under direct vision. Throughout the                            procedure, the patient's blood  pressure, pulse, and                            oxygen saturations were monitored continuously. The                            EC-[id number] ([id number]) scope was introduced through                            the anus and advanced to the 10 cm into the ileum.                            The colonoscopy was performed without difficulty.                            The patient tolerated the procedure well. The                            quality of the bowel preparation was good. The                            terminal ileum, ileocecal valve, appendiceal                            orifice, and rectum were photographed. Scope In: 9:50:43 AM Scope Out: 10:13:01 AM Scope Withdrawal Time: 0 hours 20 minutes 19 seconds  Total Procedure Duration: 0 hours 22 minutes 18 seconds  Findings:      The terminal ileum appeared normal.      Three sessile polyps were found in the sigmoid colon, descending colon       and ascending colon. The polyps were 3 to 4 mm in size. These polyps       were removed with a cold biopsy forceps. Resection and retrieval were       complete.      Multiple small and large-mouthed diverticula were found in the       transverse colon, ascending colon and cecum.      Multiple small and large-mouthed diverticula were found in the       recto-sigmoid colon, sigmoid colon and descending colon. There was       evidence of recent bleeding from the diverticular opening.      External and internal hemorrhoids were found during retroflexion. The  hemorrhoids were moderate. Impression:               - RECTAL BLEEDING MOST LIKELY DUE TO DIVERTICULOSIS                            IN SETTING OF ASPIRIN USE                           - Three 3 to 4 mm polyps in the sigmoid colon and                            in the descending colon, removed with a cold biopsy                            forceps. Resected and retrieved.                           - MODERATE Diverticulosis in the transverse  colon,                            in the ascending colon and in the cecum.                           - Severe diverticulosis in the recto-sigmoid colon,                            in the sigmoid colon and in the descending colon.                            There was evidence of recent bleeding.                           - External and internal hemorrhoids. Moderate Sedation:      Moderate (conscious) sedation was administered by the endoscopy nurse       and supervised by the endoscopist. The following parameters were       monitored: oxygen saturation, heart rate, blood pressure, and response       to care. Total physician intraservice time was 40 minutes. Recommendation:           - Repeat colonoscopy in 3 - 5 years for                            surveillance.                           - High fiber diet.                           - Continue present medications. NO ASPIRIN or                            NSAIDs UNLESS MEDICALLY NECESSARY.                           - Await pathology results.                           -  Patient has a contact number available for                            emergencies. The signs and symptoms of potential                            delayed complications were discussed with the                            patient. Return to normal activities tomorrow.                            Written discharge instructions were provided to the                            patient. Procedure Code(s):        --- Professional ---                           [phone], Colonoscopy, flexible; with biopsy, single                            or multiple                           99152, Moderate sedation services provided by the                            same physician or other qualified health care                            professional performing the diagnostic or                            therapeutic service that the sedation supports,                            requiring the presence  of an independent trained                            observer to assist in the monitoring of the                            patient's level of consciousness and physiological                            status; initial 15 minutes of intraservice time,                            patient age 50 years or older                           [phone], Moderate sedation services; each additional  15 minutes intraservice time                           99153, Moderate sedation services; each additional                            15 minutes intraservice time Diagnosis Code(s):        --- Professional ---                           K64.8, Other hemorrhoids                           K57.31, Diverticulosis of large intestine without                            perforation or abscess with bleeding                           D12.5, Benign neoplasm of sigmoid colon                           D12.4, Benign neoplasm of descending colon                           K92.1, Melena (includes Hematochezia) CPT copyright 2016 [employer]. All rights reserved. The codes documented in this report are preliminary and upon coder review may  be revised to meet current compliance requirements. Barney Drain, MD Barney Drain, MD 10/17/2016 10:28:18 AM This report has been signed electronically. Number of Addenda: 0

## 2016-10-19 NOTE — Telephone Encounter (Signed)
I reviewed over the patient's lab work which was sent to [place]. Kidney functions are stable. Potassium is low at 3.1 patient is currently on potassium 20 mEq taking 1 in the morning 2 in the evening.? Is he in fact taking this on a regular basis? May need to adjust this. Also his blood work does show prediabetes so it is important for him to maintain a low starch diet. I would not add any additional medicines. Please find out regarding potassium.

## 2016-10-20 ENCOUNTER — Encounter (HOSPITAL_COMMUNITY): Payer: Self-pay | Admitting: Gastroenterology

## 2016-10-20 MED ORDER — POTASSIUM CHLORIDE CRYS ER 20 MEQ PO TBCR: EXTENDED_RELEASE_TABLET | ORAL | 5 refills | Status: DC

## 2016-10-20 NOTE — Telephone Encounter (Signed)
Left message to return call. (Prescription sent electronically to pharmacy-blood work ordered in [employer])

## 2016-10-20 NOTE — Telephone Encounter (Signed)
I recommend that the patient adjust the potassium to use 2 in the morning 2 in the evening daily, recheck metabolic 7 in 3-4 weeks

## 2016-10-20 NOTE — Telephone Encounter (Signed)
Patient advised Dr Nicki Reaper reviewed over the patient's lab work which was sent to [place]. Kidney functions are stable. Potassium is low at 3.1 patient is currently on potassium 20 mEq taking 1 in the morning 2 in the evening.? Is he in fact taking this on a regular basis? May need to adjust this. Also his blood work does show prediabetes so it is important for him to maintain a low starch diet. Dr Nicki Reaper would not add any additional medicines.  Patient verbalized understanding and ststed he is taking the potassium as prescribed and has not missed any doses.

## 2016-10-20 NOTE — Telephone Encounter (Signed)
Left message to return call 

## 2016-10-24 ENCOUNTER — Ambulatory Visit (INDEPENDENT_AMBULATORY_CARE_PROVIDER_SITE_OTHER): Payer: PRIVATE HEALTH INSURANCE | Admitting: Urology

## 2016-10-24 DIAGNOSIS — N5201 Erectile dysfunction due to arterial insufficiency: Secondary | ICD-10-CM | POA: Diagnosis not present

## 2016-10-27 NOTE — Telephone Encounter (Signed)
Left message to return call 

## 2016-11-03 NOTE — Telephone Encounter (Signed)
Patient notified and verbalized understanding. 

## 2016-11-24 ENCOUNTER — Other Ambulatory Visit: Payer: Self-pay | Admitting: *Deleted

## 2016-11-24 ENCOUNTER — Encounter: Payer: Self-pay | Admitting: Family Medicine

## 2016-11-24 DIAGNOSIS — E876 Hypokalemia: Secondary | ICD-10-CM

## 2016-11-24 DIAGNOSIS — R7303 Prediabetes: Secondary | ICD-10-CM

## 2016-11-24 NOTE — Telephone Encounter (Signed)
Please have patient do metabolic 7 and hemoglobin A1c-prediabetes, hypokalemia please inform the patient

## 2016-11-26 ENCOUNTER — Encounter: Payer: Self-pay | Admitting: Neurology

## 2016-11-26 ENCOUNTER — Ambulatory Visit (INDEPENDENT_AMBULATORY_CARE_PROVIDER_SITE_OTHER): Payer: PRIVATE HEALTH INSURANCE | Admitting: Neurology

## 2016-11-26 VITALS — BP 144/90 | HR 56 | Ht 75.0 in | Wt 266.8 lb

## 2016-11-26 DIAGNOSIS — E1142 Type 2 diabetes mellitus with diabetic polyneuropathy: Secondary | ICD-10-CM

## 2016-11-26 NOTE — Patient Instructions (Addendum)
Alpha lipoic acid 400-600mg  daily or can try 400mg  twice daily   Peripheral Neuropathy Peripheral neuropathy is a type of nerve damage. It affects nerves that carry signals between the spinal cord and other parts of the body. These are called peripheral nerves. With peripheral neuropathy, one nerve or a group of nerves may be damaged. What are the causes? Many things can damage peripheral nerves. For some people with peripheral neuropathy, the cause is unknown. Some causes include:  Diabetes. This is the most common cause of peripheral neuropathy.  Injury to a nerve.  Pressure or stress on a nerve that lasts a long time.  Too little vitamin B. Alcoholism can lead to this.  Infections.  Autoimmune diseases, such as multiple sclerosis and systemic lupus erythematosus.  Inherited nerve diseases.  Some medicines, such as cancer drugs.  Toxic substances, such as lead and mercury.  Too little blood flowing to the legs.  Kidney disease.  Thyroid disease.  What are the signs or symptoms? Different people have different symptoms. The symptoms you have will depend on which of your nerves is damaged. Common symptoms include:  Loss of feeling (numbness) in the feet and hands.  Tingling in the feet and hands.  Pain that burns.  Very sensitive skin.  Weakness.  Not being able to move a part of the body (paralysis).  Muscle twitching.  Clumsiness or poor coordination.  Loss of balance.  Not being able to control your bladder.  Feeling dizzy.  Sexual problems.  How is this diagnosed? Peripheral neuropathy is a symptom, not a disease. Finding the cause of peripheral neuropathy can be hard. To figure that out, your health care provider will take a medical history and do a physical exam. A neurological exam will also be done. This involves checking things affected by your brain, spinal cord, and nerves (nervous system). For example, your health care provider will check your  reflexes, how you move, and what you can feel. Other types of tests may also be ordered, such as:  Blood tests.  A test of the fluid in your spinal cord.  Imaging tests, such as CT scans or an MRI.  Electromyography (EMG). This test checks the nerves that control muscles.  Nerve conduction velocity tests. These tests check how fast messages pass through your nerves.  Nerve biopsy. A small piece of nerve is removed. It is then checked under a microscope.  How is this treated?  Medicine is often used to treat peripheral neuropathy. Medicines may include: ? Pain-relieving medicines. Prescription or over-the-counter medicine may be suggested. ? Antiseizure medicine. This may be used for pain. ? Antidepressants. These also may help ease pain from neuropathy. ? Lidocaine. This is a numbing medicine. You might wear a patch or be given a shot. ? Mexiletine. This medicine is typically used to help control irregular heart rhythms.  Surgery. Surgery may be needed to relieve pressure on a nerve or to destroy a nerve that is causing pain.  Physical therapy to help movement.  Assistive devices to help movement. Follow these instructions at home:  Only take over-the-counter or prescription medicines as directed by your health care provider. Follow the instructions carefully for any given medicines. Do not take any other medicines without first getting approval from your health care provider.  If you have diabetes, work closely with your health care provider to keep your blood sugar under control.  If you have numbness in your feet: ? Check every day for signs of injury or  infection. Watch for redness, warmth, and swelling. ? Wear padded socks and comfortable shoes. These help protect your feet.  Do not do things that put pressure on your damaged nerve.  Do not smoke. Smoking keeps blood from getting to damaged nerves.  Avoid or limit alcohol. Too much alcohol can cause a lack of B  vitamins. These vitamins are needed for healthy nerves.  Develop a good support system. Coping with peripheral neuropathy can be stressful. Talk to a mental health specialist or join a support group if you are struggling.  Follow up with your health care provider as directed. Contact a health care provider if:  You have new signs or symptoms of peripheral neuropathy.  You are struggling emotionally from dealing with peripheral neuropathy.  You have a fever. Get help right away if:  You have an injury or infection that is not healing.  You feel very dizzy or begin vomiting.  You have chest pain.  You have trouble breathing. This information is not intended to replace advice given to you by your health care provider. Make sure you discuss any questions you have with your health care provider. Document Released: 03/28/2002 Document Revised: 09/13/2015 Document Reviewed: 12/13/2012 Elsevier Interactive Patient Education  2017 [employer].

## 2016-11-26 NOTE — Progress Notes (Signed)
[facility]    Provider:  Dr Jaynee Eagles Referring Provider: Kathyrn Drown, MD Primary Care Physician:  Kathyrn Drown, MD   CC:  Polyneuropathy  Interval history 11/26/2016:  Patient is here for follow-up of his small fiber neuropathy. Reviewed extensive workup which included Lyme disease, vitamin B1, sarcoid, HIV, sedimentation rate, ANA, Sjogren's, vasculitic labs, hep C, rheumatoid factor, heavy metals, B6 and myeloma which were unremarkable. Hemoglobin A1c was 6.3. Discussed with patient that even prediabetes can cause small fiber neuropathy. Discussed take control with hemoglobin A1c, diet, exercise and alpha lipoic acid.  I also reviewed images MRI of the lumbar spine with patient and agree with the following:  IMPRESSION:  This MRI of the lumbar spine without contrast shows multilevel degenerative changes as detailed above. Most significant findings are: 1.   At L2-L3, there is disc protrusion more to the left and facet hypertrophy. The left lateral recess is moderately narrowed. There is no definite nerve root compression though there is some encroachment upon the left L3 nerve root. 2.   At L3-L4, there is disc bulging and facet hypertrophy. There does not appear to be any nerve root compression. 3.   At L4-L5, there has been a prior right hemilaminectomy. There is recurrent disc protrusion, endplate spurring and facet hypertrophy. There is mild to moderate left greater than right lateral recess stenosis but no nerve root compression. 4.   At L5-S1, there is mild disc bulging and severe facet hypertrophy.  There is no nerve root compression.   HPI:  [name] [name] is a 61 y.o. male here as a referral from Dr. Wolfgang Phoenix for polyneuropathy. Past medical history hypertension, renal insufficiency, hyperlipidemia, idiopathic chronic polyneuropathy. Symptoms started around 2013. Saw a neurologist in 2014 and Dxed with small-fiber neuropathy. Started in the left foot and  now in both feet. Hands without symptoms. He has tingling, burning and numbness, worse at night. Worse on the right foot. Symptoms in the toes and below the toes and some of the top of the foot. Continuous all day long. Sometimes the pain radiates up to the left hip. Feels like it is numb. No problems with balance or walking. Slowly progressive. Heat does not help. Aspercreme helped. He has occasional back pain. He denies exposure to chemicals, trauma, new medications at onset or any inciting events. 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 He was in [place] in [place] notes, labs and imaging from outside physicians, which showed:  Primary care notes. Patient has significant neuropathy in the feet which is chronic. He often feels like there is tingling and having a hard time feeling them making it difficult to walk. It's slowly progressive and worse than it was several years ago. Over the past 6 months it's progressed even quicker and it's above the ankle region. He denies chemical exposure or injury or any inciting event. He also is frustrated by lack of strength used to be able to lift 400 pounds now he can only left 250 pounds. He does have renal insufficiency. Reviewed physical exam  which was normal. Patient had normal nerve conduction studies approximately 4 years ago.  B12 greater than 2000, TSH within normal limits 07/21/2016.  MRI of the lumbar spine 2001: Reviewed report below.   LOW BACK PAIN PARTICULARLY WHEN STANDING, SITTING, AND WALKING. MRI OF THE LUMBAR SPINE WITHOUT CONTRAST MEDIA: MULTIPLANAR AND MULTISEQUENCE IMAGES WERE ACQUIRED ACCORDING TO THE STANDARD PROTOCOL COMPARISON IS MADE TO THE 04/25/99 SCAN FROM [place] [facility]. AGAIN APPRECIATED IS THE CENTRAL DISK PROTRUSION AT L4-5. HOWEVER, THERE IS NOW A COMPONENT ON  THE RIGHT WHICH IS NOTED JUST MEDIAL TO THE FORAMEN AND INVOLVES THE RIGHT LATERAL ASPECT OF THE CENTRAL CANAL. JUST BELOW THE SUPERIOR END-PLATE DISK MATERIAL ENCROACHES ON AND POSTERIORLY DISPLACES THE RIGHT L5 NERVE ROOT. THIS IS NOTED WITHIN THE SUPERIOR ASPECT OF THE RIGHT L5 LATERAL RECESS. DOES THE PATIENT HAVE A RIGHT L5 RADICULOPATHY? FACET HYPERTROPHY IS PRESENT AT ALL LEVELS. THERE IS ALSO LIGAMENTA FLAVAL THICKENING AS WELL. 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 THERE IS NO EVIDENCE OF CONSTRICTION OF THE THECAL SAC AT ANY LEVEL. THERE IS DISKAL DEHYDRATION AT L3-4 AND L4-5 WITH MINIMAL DISK SPACE NARROWING AT L4-5. IMPRESSION DEGENERATIVE LUMBAR SPONDYLOTIC CHANGES AT MULTIPLE LEVELS. AGAIN APPRECIATED IS A SMALL CENTRAL DISK HERNIATION AT L4-5.THE PATIENT HAS DEVELOPED A RIGHT-SIDED HNP AT L4-5 AS DESCRIBED ABOVE WITH MASS EFFECT ON THE RIGHT L5 NERVE ROOT.  Reviewed EMG/NCS Dr. Merlene Laughter which was normal and diagnosed with small-fiber neuropathy  Review of Systems: Patient complains of symptoms per HPI as well as the following symptoms: Incontinence. Pertinent negatives per HPI. All others negative.  Social History   Social History  . Marital status: Married    Spouse name: N/A  . Number of children: N/A  . Years of education: N/A   Occupational History  .  [place] History Main Topics  . Smoking status: Never Smoker  . Smokeless tobacco: Never Used  . Alcohol use No  . Drug use: No  . Sexual activity: Not on file   Other Topics Concern  . Not on file   Social History Narrative   Lives at home w/ his wife   Caffeine: 1 cup per day    Family History  Problem Relation Age of Onset  . Heart Problems Mother   . Aneurysm Brother   . Colon cancer Neg Hx   . Colon polyps Neg Hx   . Neuropathy Neg Hx     Past Medical History:  Diagnosis Date  . Adenomatous colon polyp 2007 [place]    . DDD (degenerative disc disease)   . GERD (gastroesophageal reflux disease)   . Herniated disc    Back  . HTN (hypertension)   . Hyperlipemia   . Prostate CA [facility]) 2004    Past Surgical History:  Procedure Laterality Date  . COLONOSCOPY  11/15/2010   Dr. Fields:Pedunculated polyp/pan-colonic  TICS/mod IH. tubular adenoma  . COLONOSCOPY  2007   Dr. Oneida Alar: 3 mm tubular adeoma  . COLONOSCOPY N/A 03/08/2014   Procedure: COLONOSCOPY;  Surgeon: Danie Binder, MD;  [facility];  Service: Endoscopy;  Laterality: N/A;  100 - moved to 12:45 - Ginger to notify pt  . COLONOSCOPY N/A 10/17/2016   Procedure: COLONOSCOPY;  Surgeon: Danie Binder, MD;  Location: [facility];  Service: Endoscopy;  Laterality: N/A;  9:00am  . KNEE ARTHROSCOPY W/ MENISCECTOMY  2011 RIGHT  .  LAMINECTOMY AND MICRODISCECTOMY LUMBAR SPINE  2002  . PROSTATECTOMY      Current Outpatient Prescriptions  Medication Sig Dispense Refill  . b complex vitamins capsule Take 1 capsule by mouth daily.    . cetirizine (ZYRTEC) 10 MG tablet TAKE ONE (1) TABLET BY MOUTH EVERY DAY 30 tablet 0  . Cholecalciferol (VITAMIN D PO) Take by mouth daily. Patient unsure of dose    . CRESTOR 20 MG tablet TAKE ONE (1) TABLET EACH DAY 30 tablet 5  . doxazosin (CARDURA) 4 MG tablet TAKE ONE TABLET BY MOUTH EVERY NIGHT AT BEDTIME 30 tablet 5  . gabapentin (NEURONTIN) 300 MG capsule Take 1 capsule (300 mg total) by mouth 3 (three) times daily. 90 capsule 11  . HM OMEPRAZOLE 20 MG TBEC TAKE ONE TABLET ONCE DAILY 28 each 5  . indapamide (LOZOL) 2.5 MG tablet TAKE ONE (1) TABLET EACH DAY 30 tablet 5  . losartan (COZAAR) 100 MG tablet TAKE ONE (1) TABLET BY MOUTH EVERY DAY 30 tablet 5  . metoprolol succinate (TOPROL-XL) 50 MG 24 hr tablet TAKE ONE (1) TABLET EACH DAY 30 tablet 5  . Multiple Vitamin (MULTIVITAMIN) capsule Take 1 capsule by mouth daily.      Marland Kitchen NIFEdipine (PROCARDIA XL/ADALAT-CC) 90 MG 24 hr tablet TAKE ONE (1) TABLET  EACH DAY 30 tablet 5  . Omega-3 Fatty Acids (FISH OIL) 1200 MG CAPS Take by mouth daily.    . potassium chloride SA (K-DUR,KLOR-CON) 20 MEQ tablet TAKE TWO TABLETS IN THE MORNING AND TAKE TWO TABLETS EVERY EVENING 120 tablet 5  . predniSONE (DELTASONE) 10 MG tablet Take 1-2 tablets every 4-6 hours as needed (Patient taking differently: Take 10 mg by mouth daily as needed (heel pain). ) 60 tablet 5   No current facility-administered medications for this visit.     Allergies as of 11/26/2016  . (No Known Allergies)    Vitals: Ht 6\' 3"  (1.905 m)   Wt 266 lb 12.8 oz (121 kg)   BMI 33.35 kg/m  Last Weight:  Wt Readings from Last 1 Encounters:  11/26/16 266 lb 12.8 oz (121 kg)   Last Height:   Ht Readings from Last 1 Encounters:  11/26/16 6\' 3"  (1.905 m)   Physical exam: Exam: Gen: NAD, conversant, well nourised, obese, well groomed                     CV: RRR, no MRG. No Carotid Bruits. No peripheral edema, warm, nontender Eyes: Conjunctivae clear without exudates or hemorrhage  Neuro: Detailed Neurologic Exam  Speech:    Speech is normal; fluent and spontaneous with normal comprehension.  Cognition:    The patient is oriented to person, place, and time;     recent and remote memory intact;     language fluent;     normal attention, concentration,     fund of knowledge Cranial Nerves:    The pupils are equal, round, and reactive to light. . Visual fields are full to finger confrontation. Extraocular movements are intact. Trigeminal sensation is intact and the muscles of mastication are normal. The face is symmetric. The palate elevates in the midline. Hearing intact. Voice is normal. Shoulder shrug is normal. The tongue has normal motion without fasciculations.   Coordination:    Normal finger to nose and heel to shin. Normal rapid alternating movements.   Gait:    Heel-toe and tandem gait are normal.   Motor Observation:    No asymmetry,  no atrophy, and no involuntary  movements noted. Tone:    Normal muscle tone.    Posture:    Posture is normal. normal erect    Strength:    Strength is V/V in the upper and lower limbs.      Sensation: Decreased pinprick and temperature distally intact proprioception and vibration.     Reflex Exam:    Assessment/Plan:  This is a 61 year old male with chronic idiopathic polyneuropathy is worsening likely due to prediabetes as extensive workup failed to provide any other explanation. He feels his left foot is worse and he also has some radiation into the leg which may be a superimposed radiculopathy.   Addendum: Hgba1c 6.3. Labs are normal except for HgbA1c. He is very close to being diabetic. Even pre-diabetes can cause of his small-fiber neuropathy and he should return to Dr. Wolfgang Phoenix for management. May suggest starting metformin. Also start alpha-lipoic acid, 400-600mg  daily as this has been shown to improve diabetic neuropathy.Still need mri l-spine to eval for superimposed l5 radic.    Sarina Ill, MD  [facility] [address] [place], [place] [address]  Phone [phone] Fax [phone]  A total of 25 minutes was spent face-to-face with this patient. Over half this time was spent on counseling patient on the diabetic polyneuropathy diagnosis and different diagnostic and therapeutic options available.

## 2016-11-27 DIAGNOSIS — E1142 Type 2 diabetes mellitus with diabetic polyneuropathy: Secondary | ICD-10-CM | POA: Insufficient documentation

## 2016-11-28 ENCOUNTER — Encounter: Payer: Self-pay | Admitting: Family Medicine

## 2016-12-05 NOTE — Progress Notes (Signed)
REVIEWED-NO ADDITIONAL RECOMMENDATIONS. 

## 2016-12-17 LAB — BASIC METABOLIC PANEL
BUN/Creatinine Ratio: 8 — ABNORMAL LOW (ref 10–24)
BUN: 14 mg/dL (ref 8–27)
CALCIUM: 8.5 mg/dL — AB (ref 8.6–10.2)
CHLORIDE: 102 mmol/L (ref 96–106)
CO2: 28 mmol/L (ref 20–29)
CREATININE: 1.66 mg/dL — AB (ref 0.76–1.27)
GFR calc Af Amer: 50 mL/min/{1.73_m2} — ABNORMAL LOW (ref >59)
GFR calc non Af Amer: 44 mL/min/{1.73_m2} — ABNORMAL LOW (ref >59)
GLUCOSE: 97 mg/dL (ref 65–99)
Potassium: 3.5 mmol/L (ref 3.5–5.2)
Sodium: 142 mmol/L (ref 134–144)

## 2016-12-17 LAB — HEMOGLOBIN A1C
Est. average glucose Bld gHb Est-mCnc: 128 mg/dL
HEMOGLOBIN A1C: 6.1 % — AB (ref 4.8–5.6)

## 2016-12-18 ENCOUNTER — Encounter: Payer: Self-pay | Admitting: Family Medicine

## 2017-01-16 ENCOUNTER — Other Ambulatory Visit: Payer: Self-pay | Admitting: Family Medicine

## 2017-01-20 ENCOUNTER — Encounter: Payer: Self-pay | Admitting: Family Medicine

## 2017-01-20 ENCOUNTER — Ambulatory Visit (INDEPENDENT_AMBULATORY_CARE_PROVIDER_SITE_OTHER): Payer: PRIVATE HEALTH INSURANCE | Admitting: Family Medicine

## 2017-01-20 VITALS — BP 134/86 | Ht 75.0 in | Wt 256.2 lb

## 2017-01-20 DIAGNOSIS — I10 Essential (primary) hypertension: Secondary | ICD-10-CM

## 2017-01-20 DIAGNOSIS — N289 Disorder of kidney and ureter, unspecified: Secondary | ICD-10-CM | POA: Diagnosis not present

## 2017-01-20 DIAGNOSIS — E1142 Type 2 diabetes mellitus with diabetic polyneuropathy: Secondary | ICD-10-CM

## 2017-01-20 DIAGNOSIS — E1169 Type 2 diabetes mellitus with other specified complication: Secondary | ICD-10-CM | POA: Diagnosis not present

## 2017-01-20 DIAGNOSIS — E785 Hyperlipidemia, unspecified: Secondary | ICD-10-CM

## 2017-01-20 MED ORDER — LOSARTAN POTASSIUM 100 MG PO TABS: ORAL_TABLET | ORAL | 5 refills | Status: DC

## 2017-01-20 MED ORDER — NIFEDIPINE ER OSMOTIC RELEASE 90 MG PO TB24: 90.0000 mg | ORAL_TABLET | Freq: Every day | ORAL | 5 refills | Status: DC

## 2017-01-20 MED ORDER — METOPROLOL SUCCINATE ER 50 MG PO TB24: 50.0000 mg | ORAL_TABLET | Freq: Every day | ORAL | 5 refills | Status: DC

## 2017-01-20 MED ORDER — POTASSIUM CHLORIDE CRYS ER 20 MEQ PO TBCR: EXTENDED_RELEASE_TABLET | ORAL | 5 refills | Status: DC

## 2017-01-20 MED ORDER — CRESTOR 20 MG PO TABS: ORAL_TABLET | ORAL | 5 refills | Status: DC

## 2017-01-20 MED ORDER — OMEPRAZOLE 20 MG PO TBEC: 1.0000 | DELAYED_RELEASE_TABLET | Freq: Every day | ORAL | 5 refills | Status: DC

## 2017-01-20 NOTE — Progress Notes (Signed)
   Subjective:    Patient ID: [name] [name], male    DOB: 1956-01-13, 61 y.o.   MRN: [id number]  Hypertension  This is a chronic problem. The current episode started more than 1 year ago. Pertinent negatives include no chest pain, headaches or shortness of breath. Risk factors for coronary artery disease include male gender. Treatments tried: cardura,, lozol, cozaar, toprol, procardia.    patient with c/o leg pain- has an appointment scheduled with orthopedist He relates he has had intermittent calf pain on the left side it is not related to walking just relates a can of pain in the calf that comes and goes no swelling with it he states over the past few days is been much better than it is not tender or swollen or painful  This patient does have a reoccurring tendinitis of the Achilles tendon for which he uses prednisone intermittently he relates using prednisone at least 10 days out of month  He also has renal insufficiency in the past he used to take protein supplements for weight lifting but he states he has not been doing this recently he states his blood pressures been under reasonable control 130/80  Review of Systems  Constitutional: Negative for activity change, fatigue and fever.  Respiratory: Negative for cough and shortness of breath.   Cardiovascular: Negative for chest pain and leg swelling.  Neurological: Negative for headaches.     history reviewed  Objective:   Physical Exam  Constitutional: He appears well-nourished. No distress.  Cardiovascular: Normal rate, regular rhythm and normal heart sounds.   No murmur heard. Pulmonary/Chest: Effort normal and breath sounds normal. No respiratory distress.  Musculoskeletal: He exhibits no edema.  Lymphadenopathy:    He has no cervical adenopathy.  Neurological: He is alert.  Psychiatric: His behavior is normal.  Vitals reviewed.  25 minutes was spent with the patient. Greater than half the time was spent in discussion and  answering questions and counseling regarding the issues that the patient came in for today.   Patient has lost some weight through watching diet staying active     Assessment & Plan:  Left calf strain doing better now stretching exercises were shown avoid anti-inflammatories  Patient has intermittent heel issue using prednisone at least 10 times per month I advised the patient to cut back on this-this is increasing his risk of further diabetes and neuropathy  Diabetic neuropathy A1c 6.1 work hard at dietary measures regular physical activity-I am sympathetic to the patient's sister situation but there may be no repairing his problem   Patient has numbness tingling but does not have pain Will not benefit from gabapentin currently. Patient states he tried it did not do anything for him. He stopped taking it  Diabetes fair control through diet and activity  Blood pressure good control overall continue current measures  Renal insufficiency/chronic renal disease stage III-the importance of keeping his blood pressure under control cholesterol under control and sugars under control were stressed to the patient  Patient follow-up within 4-6 months time  Hyperlipidemia previous labs reviewed Sort continue current medication

## 2017-01-20 NOTE — Patient Instructions (Signed)
Diabetes Mellitus and Food It is important for you to manage your blood sugar (glucose) level. Your blood glucose level can be greatly affected by what you eat. Eating healthier foods in the appropriate amounts throughout the day at about the same time each day will help you control your blood glucose level. It can also help slow or prevent worsening of your diabetes mellitus. Healthy eating may even help you improve the level of your blood pressure and reach or maintain a healthy weight. General recommendations for healthful eating and cooking habits include:  Eating meals and snacks regularly. Avoid going long periods of time without eating to lose weight.  Eating a diet that consists mainly of plant-based foods, such as fruits, vegetables, nuts, legumes, and whole grains.  Using low-heat cooking methods, such as baking, instead of high-heat cooking methods, such as deep frying.  Work with your dietitian to make sure you understand how to use the Nutrition Facts information on food labels. How can food affect me? Carbohydrates Carbohydrates affect your blood glucose level more than any other type of food. Your dietitian will help you determine how many carbohydrates to eat at each meal and teach you how to count carbohydrates. Counting carbohydrates is important to keep your blood glucose at a healthy level, especially if you are using insulin or taking certain medicines for diabetes mellitus. Alcohol Alcohol can cause sudden decreases in blood glucose (hypoglycemia), especially if you use insulin or take certain medicines for diabetes mellitus. Hypoglycemia can be a life-threatening condition. Symptoms of hypoglycemia (sleepiness, dizziness, and disorientation) are similar to symptoms of having too much alcohol. If your health care provider has given you approval to drink alcohol, do so in moderation and use the following guidelines:  Women should not have more than one drink per day, and men  should not have more than two drinks per day. One drink is equal to: ? 12 oz of beer. ? 5 oz of wine. ? 1 oz of hard liquor.  Do not drink on an empty stomach.  Keep yourself hydrated. Have water, diet soda, or unsweetened iced tea.  Regular soda, juice, and other mixers might contain a lot of carbohydrates and should be counted.  What foods are not recommended? As you make food choices, it is important to remember that all foods are not the same. Some foods have fewer nutrients per serving than other foods, even though they might have the same number of calories or carbohydrates. It is difficult to get your body what it needs when you eat foods with fewer nutrients. Examples of foods that you should avoid that are high in calories and carbohydrates but low in nutrients include:  Trans fats (most processed foods list trans fats on the Nutrition Facts label).  Regular soda.  Juice.  Candy.  Sweets, such as cake, pie, doughnuts, and cookies.  Fried foods.  What foods can I eat? Eat nutrient-rich foods, which will nourish your body and keep you healthy. The food you should eat also will depend on several factors, including:  The calories you need.  The medicines you take.  Your weight.  Your blood glucose level.  Your blood pressure level.  Your cholesterol level.  You should eat a variety of foods, including:  Protein. ? Lean cuts of meat. ? Proteins low in saturated fats, such as fish, egg whites, and beans. Avoid processed meats.  Fruits and vegetables. ? Fruits and vegetables that may help control blood glucose levels, such as apples,   mangoes, and yams.  Dairy products. ? Choose fat-free or low-fat dairy products, such as milk, yogurt, and cheese.  Grains, bread, pasta, and rice. ? Choose whole grain products, such as multigrain bread, whole oats, and brown rice. These foods may help control blood pressure.  Fats. ? Foods containing healthful fats, such as  nuts, avocado, olive oil, canola oil, and fish.  Does everyone with diabetes mellitus have the same meal plan? Because every person with diabetes mellitus is different, there is not one meal plan that works for everyone. It is very important that you meet with a dietitian who will help you create a meal plan that is just right for you. This information is not intended to replace advice given to you by your health care provider. Make sure you discuss any questions you have with your health care provider. Document Released: 01/02/2005 Document Revised: 09/13/2015 Document Reviewed: 03/04/2013 Elsevier Interactive Patient Education  2017 Elsevier Inc.  

## 2017-01-21 ENCOUNTER — Ambulatory Visit: Payer: PRIVATE HEALTH INSURANCE | Admitting: Orthopedic Surgery

## 2017-01-21 LAB — LIPID PANEL
CHOLESTEROL TOTAL: 164 mg/dL (ref 100–199)
Chol/HDL Ratio: 2.9 ratio (ref 0.0–5.0)
HDL: 57 mg/dL (ref >39)
LDL CALC: 95 mg/dL (ref 0–99)
Triglycerides: 58 mg/dL (ref 0–149)
VLDL Cholesterol Cal: 12 mg/dL (ref 5–40)

## 2017-02-18 ENCOUNTER — Ambulatory Visit (INDEPENDENT_AMBULATORY_CARE_PROVIDER_SITE_OTHER): Payer: PRIVATE HEALTH INSURANCE | Admitting: Orthopedic Surgery

## 2017-02-18 ENCOUNTER — Encounter: Payer: Self-pay | Admitting: Orthopedic Surgery

## 2017-02-18 VITALS — BP 126/79 | HR 71 | Ht 75.0 in | Wt 259.0 lb

## 2017-02-18 DIAGNOSIS — M48062 Spinal stenosis, lumbar region with neurogenic claudication: Secondary | ICD-10-CM | POA: Diagnosis not present

## 2017-02-18 NOTE — Progress Notes (Signed)
Progress Note   Patient ID: [name] [name], male   DOB: September 26, 1955, 61 y.o.   MRN: [id number]  Chief Complaint  Patient presents with  . Leg Pain    left calf     61 year old male status post lumbar laminectomy presents with 5 month history ofdull aching pain left calf left leg with feelings of giving way and numbness and tingling in his left foot.  He has been worked up by neurology he had an MRI shows he has spinal stenosis and recurrent disc protrusion  He has taken some gabapentin in the past but has stopped taking that.     Review of Systems  Constitutional: Negative.   Gastrointestinal: Negative.   Genitourinary: Negative.   Skin: Negative.    Current Meds  Medication Sig  . b complex vitamins capsule Take 1 capsule by mouth daily.  . cetirizine (ZYRTEC) 10 MG tablet TAKE ONE (1) TABLET BY MOUTH EVERY DAY  . Cholecalciferol (VITAMIN D PO) Take by mouth daily. Patient unsure of dose  . CRESTOR 20 MG tablet TAKE ONE (1) TABLET EACH DAY  . doxazosin (CARDURA) 4 MG tablet TAKE ONE TABLET BY MOUTH EVERY NIGHT AT BEDTIME  . indapamide (LOZOL) 2.5 MG tablet TAKE ONE (1) TABLET EACH DAY  . losartan (COZAAR) 100 MG tablet TAKE ONE (1) TABLET BY MOUTH EVERY DAY  . metoprolol succinate (TOPROL-XL) 50 MG 24 hr tablet Take 1 tablet (50 mg total) by mouth daily. Take with or immediately following a meal.  . Multiple Vitamin (MULTIVITAMIN) capsule Take 1 capsule by mouth daily.    Marland Kitchen NIFEdipine (PROCARDIA XL/ADALAT-CC) 90 MG 24 hr tablet Take 1 tablet (90 mg total) by mouth daily.  . Omega-3 Fatty Acids (FISH OIL) 1200 MG CAPS Take by mouth daily.  . Omeprazole (HM OMEPRAZOLE) 20 MG TBEC Take 1 tablet (20 mg total) by mouth daily.  . potassium chloride SA (K-DUR,KLOR-CON) 20 MEQ tablet TAKE TWO TABLETS IN THE MORNING AND TAKE TWO TABLETS EVERY EVENING  . predniSONE (DELTASONE) 10 MG tablet Take 1-2 tablets every 4-6 hours as needed (Patient taking differently: Take 10 mg by mouth daily  as needed (heel pain). )    Past Medical History:  Diagnosis Date  . Adenomatous colon polyp 2007 [place]  . DDD (degenerative disc disease)   . GERD (gastroesophageal reflux disease)   . Herniated disc    Back  . HTN (hypertension)   . Hyperlipemia   . Prostate CA ([facility]) 2004     No Known Allergies  BP 126/79   Pulse 71   Ht 6\' 3"  (1.905 m)   Wt 259 lb (117.5 kg)   BMI 32.37 kg/m    Physical Exam Gen. appearance the patient's appearance is normal with normal grooming and  hygiene The patient is oriented to person place and time Mood and affect are normal   Ortho Exam  Examination of the left leg  His gait is normal although after he sat in the chair and got up he had weakness in the left leg with feeling of giving way  He has normal range of motion in the hip with a stable hip normal range of motion in the knee with a stable knee hip flexors knee extensors knee flexion dorsiflexion plantar flexion strength normal skin left leg normal no sensory changes such as decreased sensation pulses are normal  No tenderness in the hip or knee  Mild lower lumbar tenderness left buttock tenderness left SI joint tenderness.  Medical decision-making  His MRI shows multilevel disc disease with spinal stenosis at L3-4 and L4-5   Encounter Diagnosis  Name Primary?  . Spinal stenosis, lumbar region, with neurogenic claudication Yes    I recommend he see the neurosurgeon again for further evaluation and treatment  Recommend resume gabapentin at night     Arther Abbott, MD 02/18/2017 5:00 PM

## 2017-02-18 NOTE — Addendum Note (Signed)
Addended byCandice Camp on: 02/18/2017 05:07 PM   Modules accepted: Orders

## 2017-03-19 ENCOUNTER — Other Ambulatory Visit: Payer: Self-pay | Admitting: Family Medicine

## 2017-03-23 ENCOUNTER — Other Ambulatory Visit: Payer: Self-pay | Admitting: Family Medicine

## 2017-04-20 ENCOUNTER — Ambulatory Visit: Payer: PRIVATE HEALTH INSURANCE | Admitting: Family Medicine

## 2017-04-20 ENCOUNTER — Encounter: Payer: Self-pay | Admitting: Family Medicine

## 2017-04-20 VITALS — BP 128/86 | Temp 98.0°F | Ht 75.0 in | Wt 262.6 lb

## 2017-04-20 DIAGNOSIS — B338 Other specified viral diseases: Secondary | ICD-10-CM

## 2017-04-20 DIAGNOSIS — J019 Acute sinusitis, unspecified: Secondary | ICD-10-CM | POA: Diagnosis not present

## 2017-04-20 DIAGNOSIS — B348 Other viral infections of unspecified site: Secondary | ICD-10-CM

## 2017-04-20 MED ORDER — AMOXICILLIN-POT CLAVULANATE 875-125 MG PO TABS: 1.0000 | ORAL_TABLET | Freq: Two times a day (BID) | ORAL | 0 refills | Status: DC

## 2017-04-20 MED ORDER — HYDROCODONE-HOMATROPINE 5-1.5 MG/5ML PO SYRP: ORAL_SOLUTION | ORAL | 0 refills | Status: DC

## 2017-04-20 NOTE — Progress Notes (Deleted)
Subjective:     Patient ID: [name] [name], male   DOB: 07/04/1955, 61 y.o.   MRN: [id number]  HPI   Review of Systems     Objective:   Physical Exam     Assessment:     ***    Plan:     ***

## 2017-04-20 NOTE — Progress Notes (Signed)
   Subjective:    Patient ID: [name] [name], male    DOB: 1956/01/24, 61 y.o.   MRN: [id number]  Cough  This is a new problem. The current episode started in the past 7 days. Associated symptoms include headaches, myalgias, nasal congestion and a sore throat. He has tried OTC cough suppressant for the symptoms.   Relates it started on Friday with headache body ache chills denies wheezing difficulty breathing then over the next few days moved into his sinuses and into his chest   Review of Systems  HENT: Positive for sore throat.   Respiratory: Positive for cough.   Musculoskeletal: Positive for myalgias.  Neurological: Positive for headaches.       Objective:   Physical Exam  Constitutional: He appears well-developed.  HENT:  Head: Normocephalic.  Mouth/Throat: Oropharynx is clear and moist. No oropharyngeal exudate.  Neck: Normal range of motion.  Cardiovascular: Normal rate, regular rhythm and normal heart sounds.  No murmur heard. Pulmonary/Chest: Effort normal and breath sounds normal. He has no wheezes.  Lymphadenopathy:    He has no cervical adenopathy.  Neurological: He exhibits normal muscle tone.  Skin: Skin is warm and dry.  Nursing note and vitals reviewed.         Assessment & Plan:  Patient was seen today for upper respiratory illness. It is felt that the patient is dealing with sinusitis. Antibiotics were prescribed today. Importance of compliance with medication was discussed. Symptoms should gradually resolve over the course of the next several days. If high fevers, progressive illness, difficulty breathing, worsening condition or failure for symptoms to improve over the next several days then the patient is to follow-up. If any emergent conditions the patient is to follow-up in the emergency department otherwise to follow-up in the office.  More than likely parainfluenza illness with secondary sinus infection and also mild respiratory involvement but does  not need inhaler.  Warning signs were discussed if worse follow-up

## 2017-04-24 ENCOUNTER — Telehealth: Payer: Self-pay | Admitting: Family Medicine

## 2017-04-24 MED ORDER — CEFPROZIL 500 MG PO TABS: 500.0000 mg | ORAL_TABLET | Freq: Two times a day (BID) | ORAL | 0 refills | Status: DC

## 2017-04-24 NOTE — Telephone Encounter (Signed)
Patient seen Dr. Nicki Reaper on 04/20/17 for rhinosinusitis.  He believes the Augmentin is giving him headaches and diarrhea.  He would like the antibiotic changed to something different.  [facility]

## 2017-04-24 NOTE — Telephone Encounter (Addendum)
Prescription sent electronically to pharmacy. Patient notified. 

## 2017-04-24 NOTE — Telephone Encounter (Signed)
I would recommend stopping Augmentin.  Instead let us go with Cefzil 500 mg 1 twice daily for 10 days

## 2017-05-06 ENCOUNTER — Other Ambulatory Visit: Payer: Self-pay | Admitting: Orthopedic Surgery

## 2017-05-08 ENCOUNTER — Other Ambulatory Visit: Payer: Self-pay | Admitting: Orthopedic Surgery

## 2017-06-03 ENCOUNTER — Other Ambulatory Visit: Payer: Self-pay | Admitting: Family Medicine

## 2017-06-03 ENCOUNTER — Telehealth: Payer: Self-pay | Admitting: Family Medicine

## 2017-06-03 NOTE — Telephone Encounter (Signed)
Patient said that he thinks 1 of the medications he takes on a daily basis is causing him trouble sleeping.  He is unsure which one.  He wants to know what Dr. Nicki Reaper thinks.

## 2017-06-03 NOTE — Telephone Encounter (Signed)
Looking at his medication list the most likely possibility would be prednisone.  The other medications would not cause insomnia

## 2017-06-04 NOTE — Telephone Encounter (Signed)
Patient stated he has been taking his prednisone at bedtime and is going to switch and take it in the morning to see if that helps.

## 2017-06-26 ENCOUNTER — Telehealth: Payer: Self-pay | Admitting: Family Medicine

## 2017-06-26 NOTE — Telephone Encounter (Signed)
Pt called stating that he seen the dr that we referred him to regarding his back and states that it is not any better. Pt states that the dr stated that he may need to do injections. Pt is wanting to know if we will be referring him somewhere for that or if the other dr will be. Please advise.

## 2017-06-26 NOTE — Telephone Encounter (Signed)
I spoke with the pt.He was confused he had saw Dr.Stern in [place] whom had suggested that he have injections in his back for the pain and he was not understanding that he had to call their office back to set those up not here.He states he will be calling Dr.Stern back to arrange these.

## 2017-07-09 ENCOUNTER — Telehealth: Payer: Self-pay | Admitting: Family Medicine

## 2017-07-09 NOTE — Telephone Encounter (Signed)
Pt dropped off lab results. See in red folder in office.

## 2017-07-10 ENCOUNTER — Encounter: Payer: Self-pay | Admitting: Family Medicine

## 2017-07-10 NOTE — Telephone Encounter (Signed)
I reviewed over his lab work.  Hemoglobin good at 14 urinalysis negative for protein PSA less than 0.13 creatinine 1.66 potassium 3.4 LDL 100 HDL 66 TSH 0.79 liver enzymes normal patient is to bring all of his medications with him a letter was sent to him he has a checkup in April

## 2017-07-24 ENCOUNTER — Encounter: Payer: Self-pay | Admitting: Family Medicine

## 2017-07-24 ENCOUNTER — Ambulatory Visit (INDEPENDENT_AMBULATORY_CARE_PROVIDER_SITE_OTHER): Payer: PRIVATE HEALTH INSURANCE | Admitting: Family Medicine

## 2017-07-24 VITALS — BP 128/82 | Ht 75.0 in | Wt 263.0 lb

## 2017-07-24 DIAGNOSIS — E7849 Other hyperlipidemia: Secondary | ICD-10-CM | POA: Diagnosis not present

## 2017-07-24 DIAGNOSIS — Z0000 Encounter for general adult medical examination without abnormal findings: Secondary | ICD-10-CM

## 2017-07-24 DIAGNOSIS — I10 Essential (primary) hypertension: Secondary | ICD-10-CM | POA: Diagnosis not present

## 2017-07-24 DIAGNOSIS — Z0001 Encounter for general adult medical examination with abnormal findings: Secondary | ICD-10-CM | POA: Diagnosis not present

## 2017-07-24 DIAGNOSIS — N289 Disorder of kidney and ureter, unspecified: Secondary | ICD-10-CM

## 2017-07-24 MED ORDER — METOPROLOL SUCCINATE ER 50 MG PO TB24: 50.0000 mg | ORAL_TABLET | Freq: Every day | ORAL | 5 refills | Status: DC

## 2017-07-24 MED ORDER — LOSARTAN POTASSIUM 100 MG PO TABS: ORAL_TABLET | ORAL | 5 refills | Status: DC

## 2017-07-24 MED ORDER — DOXAZOSIN MESYLATE 4 MG PO TABS: 4.0000 mg | ORAL_TABLET | Freq: Every day | ORAL | 5 refills | Status: DC

## 2017-07-24 MED ORDER — CRESTOR 20 MG PO TABS: ORAL_TABLET | ORAL | 5 refills | Status: DC

## 2017-07-24 MED ORDER — NIFEDIPINE ER OSMOTIC RELEASE 90 MG PO TB24: 90.0000 mg | ORAL_TABLET | Freq: Every day | ORAL | 5 refills | Status: DC

## 2017-07-24 MED ORDER — POTASSIUM CHLORIDE CRYS ER 20 MEQ PO TBCR: EXTENDED_RELEASE_TABLET | ORAL | 5 refills | Status: DC

## 2017-07-24 MED ORDER — INDAPAMIDE 2.5 MG PO TABS: 2.5000 mg | ORAL_TABLET | Freq: Every day | ORAL | 5 refills | Status: DC

## 2017-07-24 NOTE — Progress Notes (Signed)
Subjective:    Patient ID: [name] [name], male    DOB: Sep 09, 1955, 62 y.o.   MRN: [id number]  HPI  The patient comes in today for a wellness visit.  Patient for blood pressure check up. Patient relates compliance with meds. Todays BP reviewed with the patient. Patient denies issues with medication. Patient relates reasonable diet. Patient tries to minimize salt. Patient aware of BP goals.  Patient here for follow-up regarding cholesterol.  Patient does try to maintain a reasonable diet.  Patient does take the medication on a regular basis.  Denies missing a dose.  The patient denies any obvious side effects.  Prior blood work results reviewed with the patient.  The patient is aware of his cholesterol goals and the need to keep it under good control to lessen the risk of disease.  Patient also has renal dysfunction.  He is a muscular guy who in the past used to take in a fair amount of protein in addition to this he does exercise on a regular basis does a lot of weight lifting in addition to this has had blood pressure episodes for years  He also has left leg sciatica and intermittent numbness into the foot that is being monitored and followed by specialist.  He gets his lab work through the [place] recently submitted it and we reviewed it see message  A review of their health history was completed.  A review of medications was also completed.  Any needed refills; Yes  Eating habits: Good  Falls/  MVA accidents in past few months: None  Regular exercise: Yes  Specailist pt sees on regular basis: Dr.Stern, and Dr. Vernie Shanks    Preventative health issues were discussed.   Additional concerns: Back pain that radiates down leg,sees Dr.Stern for this in the past.   Review of Systems  Constitutional: Negative for activity change, appetite change and fatigue.  HENT: Negative for congestion and rhinorrhea.   Respiratory: Negative for cough, chest tightness and shortness of breath.     Cardiovascular: Negative for chest pain and leg swelling.  Gastrointestinal: Negative for abdominal pain, diarrhea and nausea.  Endocrine: Negative for polydipsia and polyphagia.  Genitourinary: Negative for dysuria and hematuria.  Neurological: Negative for weakness and headaches.  Psychiatric/Behavioral: Negative for confusion and dysphoric mood.       Objective:   Physical Exam  Constitutional: He appears well-developed and well-nourished.  HENT:  Head: Normocephalic and atraumatic.  Right Ear: External ear normal.  Left Ear: External ear normal.  Nose: Nose normal.  Mouth/Throat: Oropharynx is clear and moist.  Eyes: Right eye exhibits no discharge. Left eye exhibits no discharge. No scleral icterus.  Neck: Normal range of motion. Neck supple. No thyromegaly present.  Cardiovascular: Normal rate, regular rhythm and normal heart sounds.  No murmur heard. Pulmonary/Chest: Effort normal and breath sounds normal. No respiratory distress. He has no wheezes.  Abdominal: Soft. Bowel sounds are normal. He exhibits no distension and no mass. There is no tenderness.  Genitourinary: Penis normal.  Musculoskeletal: Normal range of motion. He exhibits no edema.  Lymphadenopathy:    He has no cervical adenopathy.  Neurological: He is alert. He exhibits normal muscle tone. Coordination normal.  Skin: Skin is warm and dry. No erythema.  Psychiatric: He has a normal mood and affect. His behavior is normal. Judgment normal.   This patient has had prostate cancer prostate removed he was on testosterone he is no longer on testosterone because it is not covered by  his insurance he wonders what his testosterone reading is currently but this was not checked by [facility]       Assessment & Plan:  Adult wellness-complete.wellness physical was conducted today. Importance of diet and exercise were discussed in detail. In addition to this a discussion regarding safety was also covered. We also  reviewed over immunizations and gave recommendations regarding current immunization needed for age. In addition to this additional areas were also touched on including: Preventative health exams needed: Colonoscopy currently he is up-to-date  Patient was advised yearly wellness exam  Blood pressure very good today continue healthy diet regular physical activity watching diet closely trying to keep weight check cardio exercise and avoid excessive protein  Cholesterol good control overall on medication tolerating well I do not feel the medication is causing his neuropathy

## 2017-07-24 NOTE — Addendum Note (Signed)
Addended by: Sallee Lange A on: 07/24/2017 08:14 PM   Modules accepted: Orders

## 2017-08-11 ENCOUNTER — Encounter: Payer: Self-pay | Admitting: Family Medicine

## 2017-09-15 ENCOUNTER — Other Ambulatory Visit: Payer: Self-pay | Admitting: Family Medicine

## 2017-09-18 ENCOUNTER — Telehealth: Payer: Self-pay | Admitting: *Deleted

## 2017-09-18 DIAGNOSIS — N528 Other male erectile dysfunction: Secondary | ICD-10-CM

## 2017-09-18 DIAGNOSIS — N289 Disorder of kidney and ureter, unspecified: Secondary | ICD-10-CM

## 2017-09-18 DIAGNOSIS — E785 Hyperlipidemia, unspecified: Secondary | ICD-10-CM

## 2017-09-18 DIAGNOSIS — Z131 Encounter for screening for diabetes mellitus: Secondary | ICD-10-CM

## 2017-09-18 DIAGNOSIS — Z13220 Encounter for screening for lipoid disorders: Secondary | ICD-10-CM

## 2017-09-18 NOTE — Telephone Encounter (Signed)
Please review patient's lab work that is in folder.

## 2017-09-21 NOTE — Telephone Encounter (Signed)
I called and left a vm asked that he r/c. Orders have been placed.

## 2017-09-21 NOTE — Telephone Encounter (Signed)
I reviewed over what was sent to [place].  Nurses-please let the patient know that the patient's A1c is gone up.  Last fall it was 6.1 now it is 6.4 it would be very important for the patient to adhere to a healthy diet.  I also recommend nutritional consultation for the patient to be fully educated regarding what to eat and what not to eat.  If physical activity and diet get this under control we do not need to add additional medicines.  Whereas if these numbers continue to go up he will need to be on medication.  I recommend lipid profile, metabolic 7, F0Y in September with a follow-up office visit.  Also recommend the patient to stay physically active to be careful with portions and what he eats.  I also recommend diabetic nutritional consultation (the patient may or may not agree to go to nutritional consultation)

## 2017-09-22 NOTE — Telephone Encounter (Signed)
Pt returned call. Pt is setting up follow up appt for September. Pt stated he had seen diabetic counseling one time before and does not want to go at this time.

## 2017-09-28 ENCOUNTER — Ambulatory Visit: Payer: PRIVATE HEALTH INSURANCE | Admitting: Orthopedic Surgery

## 2017-09-28 ENCOUNTER — Ambulatory Visit (INDEPENDENT_AMBULATORY_CARE_PROVIDER_SITE_OTHER): Payer: PRIVATE HEALTH INSURANCE

## 2017-09-28 ENCOUNTER — Encounter: Payer: Self-pay | Admitting: Orthopedic Surgery

## 2017-09-28 VITALS — BP 111/71 | HR 57 | Ht 75.0 in | Wt 259.0 lb

## 2017-09-28 DIAGNOSIS — M7521 Bicipital tendinitis, right shoulder: Secondary | ICD-10-CM | POA: Diagnosis not present

## 2017-09-28 DIAGNOSIS — M25511 Pain in right shoulder: Secondary | ICD-10-CM | POA: Diagnosis not present

## 2017-09-28 NOTE — Progress Notes (Signed)
Progress Note //  New problem  Patient ID: [name] [name], male   DOB: [birth year], 62 y.o.   MRN: [id number] Chief Complaint  Patient presents with  . Shoulder Pain    Right shoulder pain, no injury.     Medical decision-making  Imaging:  Xrays ordered: y/n ? YES  See x-ray report x-ray look normal type II acromion  Encounter Diagnoses  Name Primary?  . Pain in joint of right shoulder   . Tendonitis of long head of biceps brachii of right shoulder Yes   PLAN:  I injected his biceps tendon and like him to lay off biceps exercises for the next week and then see him back and if no improvement recommend MRI as he did have some rotator cuff weakness  Biceps tendon injection Right biceps tendon was injected The patient gave verbal consent for cortisone injection Timeout confirmed the site of injection Medications used included 40 mg of Depo-Medrol and 3 mL 1% lidocaine After alcohol and ethyl chloride preparation the point of maximal tenderness was injected over the right biceps tendon there were no complications   FURTHER WORKUP: NO  No orders of the defined types were placed in this encounter.   Chief Complaint  Patient presents with  . Shoulder Pain    Right shoulder pain, no injury.    62 year old male who still lifting weights pretty heavily presents with right shoulder pain no trauma.  He has 2-week history of painful range of motion and loss of motion in his right upper extremity and right shoulder where he could not put his clothes on he could not drive or shifted gear on his car.  Again he has no trauma.  It seems to be improving.  The pain is along the anterior aspect of the right shoulder and the right biceps tendon proximally.  He could not take prednisone because of other medications that he is on he tried some over-the-counter medications and topical creams without relief.    Review of Systems  Respiratory: Negative for shortness of breath.   Cardiovascular:  Negative for chest pain.  Neurological: Negative for tingling.   No outpatient medications have been marked as taking for the 09/28/17 encounter (Office Visit) with Carole Civil, MD.    Past Medical History:  Diagnosis Date  . Adenomatous colon polyp 2007 [place]  . DDD (degenerative disc disease)   . GERD (gastroesophageal reflux disease)   . Herniated disc    Back  . HTN (hypertension)   . Hyperlipemia   . Prostate CA ([place]) 2004     No Known Allergies  BP 111/71   Pulse (!) 57   Ht 6\' 3"  (1.905 m)   Wt 259 lb (117.5 kg)   BMI 32.37 kg/m    Physical Exam  Constitutional: He is oriented to person, place, and time. He appears well-developed and well-nourished.  Vital signs have been reviewed and are stable. Gen. appearance the patient is well-developed and well-nourished with normal grooming and hygiene.   Neurological: He is alert and oriented to person, place, and time.  Skin: Skin is warm and dry. No erythema.  Psychiatric: He has a normal mood and affect.  Vitals reviewed.   Ortho Exam   Right shoulder alignment looks good is tender to palpation over the biceps tendon in the bicipital groove his active range of motion is limited in flexion as well as abduction.  He has some pain with external rotation.  He is stable in abduction external  rotation his rotator cuff strength is weak as is his biceps strength.  Skin is warm dry and intact pulse and perfusion normal lymph nodes negative sensation normal  Provocative test abduction external rotation test normal  Drop arm test normal  Yergason test positive and Speed test positive  Impingement sign positive.  Left shoulder range of motion strength and stability are normal  Arther Abbott, MD  9:51 AM 09/28/2017

## 2017-10-02 ENCOUNTER — Encounter: Payer: Self-pay | Admitting: Family Medicine

## 2017-10-02 NOTE — Telephone Encounter (Signed)
Called pt to get more info but had to leave a message to return call.

## 2017-10-06 ENCOUNTER — Ambulatory Visit: Payer: PRIVATE HEALTH INSURANCE | Admitting: Family Medicine

## 2017-10-06 ENCOUNTER — Encounter: Payer: Self-pay | Admitting: Family Medicine

## 2017-10-06 VITALS — BP 128/76 | Ht 75.0 in | Wt 256.0 lb

## 2017-10-06 DIAGNOSIS — M5432 Sciatica, left side: Secondary | ICD-10-CM

## 2017-10-06 DIAGNOSIS — E1142 Type 2 diabetes mellitus with diabetic polyneuropathy: Secondary | ICD-10-CM

## 2017-10-06 MED ORDER — TRAMADOL HCL 50 MG PO TABS: 50.0000 mg | ORAL_TABLET | Freq: Four times a day (QID) | ORAL | 0 refills | Status: AC | PRN

## 2017-10-06 MED ORDER — GABAPENTIN 100 MG PO CAPS: 100.0000 mg | ORAL_CAPSULE | Freq: Three times a day (TID) | ORAL | 3 refills | Status: DC

## 2017-10-06 NOTE — Progress Notes (Signed)
   Subjective:    Patient ID: [name] [name], male    DOB: 25-Dec-1955, 62 y.o.   MRN: [id number]  HPIleft lower leg pain. Started about 3 -4 months ago. No treatments tried.  He relates burning pain and discomfort in the left lower leg times it severe as 9 out of 10 most of the time it comes and goes he denies any previous injury has had some troubles has had a couple injections that did not really seem to help a whole lot he is tried some over-the-counter measures without much success  He also has diabetic neuropathy in both feet with intermittent burning tingling but he feels like this is different denies any weakness denies any loss of bowel or bladder control    Review of Systems  Constitutional: Negative for activity change.  HENT: Negative for congestion and rhinorrhea.   Respiratory: Negative for cough and shortness of breath.   Cardiovascular: Negative for chest pain.  Gastrointestinal: Negative for abdominal pain, diarrhea, nausea and vomiting.  Genitourinary: Negative for dysuria and hematuria.  Musculoskeletal: Positive for arthralgias and back pain.  Neurological: Negative for weakness and headaches.  Psychiatric/Behavioral: Negative for confusion.       Objective:   Physical Exam  Constitutional: He appears well-nourished.  HENT:  Head: Normocephalic and atraumatic.  Eyes: Right eye exhibits no discharge. Left eye exhibits no discharge.  Neck: No tracheal deviation present.  Cardiovascular: Normal rate, regular rhythm and normal heart sounds.  No murmur heard. Pulmonary/Chest: Effort normal and breath sounds normal.  Musculoskeletal: He exhibits no edema.  Lymphadenopathy:    He has no cervical adenopathy.  Neurological: He is alert. Coordination normal.  Psychiatric: His behavior is normal.  Vitals reviewed.  Negative dropfoot good range of motion can walk on heels and toes       Assessment & Plan:  Severe pain left lower leg Will need nerve conduction  study along with EMG Has seen [name] [name] in the past therefore we will help set this up Patient was talked to regarding gabapentin he is open to trying this 100 mg 1 3 times daily slowly titrate this up give [place] feedback within the next few weeks how this is going Tramadol pain medicine only when necessary for severe pain not for frequent use Patient is a follow-up within 3 months has follow-up regarding diabetes  Patient has diabetic neuropathy but I do not feel this is causing his lower leg pain I believe the lower leg pain is related to sciatica issues in the low back he has had some injections without success

## 2017-10-07 ENCOUNTER — Encounter: Payer: Self-pay | Admitting: Family Medicine

## 2017-10-14 ENCOUNTER — Other Ambulatory Visit: Payer: Self-pay | Admitting: Family Medicine

## 2017-10-15 ENCOUNTER — Encounter: Payer: Self-pay | Admitting: Family Medicine

## 2017-10-15 ENCOUNTER — Encounter (INDEPENDENT_AMBULATORY_CARE_PROVIDER_SITE_OTHER): Payer: Self-pay

## 2017-10-18 ENCOUNTER — Other Ambulatory Visit: Payer: Self-pay | Admitting: Family Medicine

## 2017-10-20 ENCOUNTER — Encounter: Payer: Self-pay | Admitting: Orthopedic Surgery

## 2017-10-20 ENCOUNTER — Encounter: Payer: Self-pay | Admitting: Family Medicine

## 2017-10-28 ENCOUNTER — Telehealth: Payer: Self-pay | Admitting: Radiology

## 2017-10-28 ENCOUNTER — Encounter: Payer: Self-pay | Admitting: Orthopedic Surgery

## 2017-10-28 ENCOUNTER — Ambulatory Visit (INDEPENDENT_AMBULATORY_CARE_PROVIDER_SITE_OTHER): Payer: PRIVATE HEALTH INSURANCE | Admitting: Orthopedic Surgery

## 2017-10-28 VITALS — BP 146/89 | HR 68 | Ht 75.0 in | Wt 263.0 lb

## 2017-10-28 DIAGNOSIS — M7521 Bicipital tendinitis, right shoulder: Secondary | ICD-10-CM | POA: Diagnosis not present

## 2017-10-28 DIAGNOSIS — M25511 Pain in right shoulder: Secondary | ICD-10-CM | POA: Diagnosis not present

## 2017-10-28 DIAGNOSIS — G8929 Other chronic pain: Secondary | ICD-10-CM | POA: Diagnosis not present

## 2017-10-28 NOTE — Progress Notes (Signed)
Chief Complaint  Patient presents with  . Shoulder Pain    right biceps area still painful, a little better    62 year old male with chronic right shoulder pain came in a few weeks back with anterior shoulder pain over the biceps tendon we gave him an injection he did get a little bit better.  He is on Advil already.  He is modified his activities in terms of weight lifting.  However he still has weakness and pain at night  Review of systems shows no numbness or tingling in the right arm  BP (!) 146/89   Pulse 68   Ht 6\' 3"  (1.905 m)   Wt 263 lb (119.3 kg)   BMI 32.87 kg/m  He is awake alert and oriented x3's mood and affect are normal his well-developed well-nourished normal gait   Normal neurovascular exam of the right upper extremity  He has significant weakness in abduction and flexion on the right compared to the left he has tenderness over the biceps tendon and a positive impingement sign.  His drop arm test is negative his Hornblower sign test is negative his supraspinatus weakness is graded 3 out of 5  I injected his biceps tendon  MRI to evaluate his rotator cuff for tear possible surgery also evaluate biceps tendon and evaluate for SLAP lesion  Biceps tendon injection Right biceps tendon was injected The patient gave verbal consent for cortisone injection Timeout confirmed the site of injection Medications used included 40 mg of Depo-Medrol and 3 mL 1% lidocaine After alcohol and ethyl chloride preparation the point of maximal tenderness was injected over the right biceps tendon there were no complications

## 2017-10-28 NOTE — Addendum Note (Signed)
Addended byCandice Camp on: 10/28/2017 10:55 AM   Modules accepted: Orders

## 2017-10-28 NOTE — Telephone Encounter (Signed)
I spoke to nurse at [facility] and MRI was approved, order sent to [place]. Left message for patient to call me back, he can schedule the study now. He can call [phone]

## 2017-10-29 NOTE — Telephone Encounter (Signed)
I have advised patient of the approval asked him to call to schedule, then call here to make follow up

## 2017-10-31 ENCOUNTER — Ambulatory Visit
Admission: RE | Admit: 2017-10-31 | Discharge: 2017-10-31 | Disposition: A | Discharge status: Home / Self Care | Payer: PRIVATE HEALTH INSURANCE | Source: Ambulatory Visit | Attending: Orthopedic Surgery | Admitting: Orthopedic Surgery

## 2017-10-31 DIAGNOSIS — G8929 Other chronic pain: Secondary | ICD-10-CM

## 2017-10-31 DIAGNOSIS — M25511 Pain in right shoulder: Principal | ICD-10-CM

## 2017-11-06 ENCOUNTER — Ambulatory Visit (INDEPENDENT_AMBULATORY_CARE_PROVIDER_SITE_OTHER): Payer: PRIVATE HEALTH INSURANCE | Admitting: Orthopedic Surgery

## 2017-11-06 VITALS — BP 129/75 | HR 58 | Ht 75.0 in | Wt 263.0 lb

## 2017-11-06 DIAGNOSIS — M7521 Bicipital tendinitis, right shoulder: Secondary | ICD-10-CM

## 2017-11-06 DIAGNOSIS — M75121 Complete rotator cuff tear or rupture of right shoulder, not specified as traumatic: Secondary | ICD-10-CM

## 2017-11-06 NOTE — Progress Notes (Signed)
Chief Complaint  Patient presents with  . Follow-up    Recheck on right shoulder and review MRI results.    62 year old male been followed for right shoulder pain received 2 injections continue to have rotator cuff weakness.  He had an MRI which shows a 1.6 cm retracted full-thickness supraspinatus tear with a infraspinatus tear as well with retraction to the mid humeral head he also has acromioclavicular moderate arthritis type II acromion mild glenohumeral joint arthritis mild subluxation of the humeral head proximally.  He has some atrophy in the infraspinatus  We discussed his options which include surgery versus continued nonoperative treatment with risk of further arthritis and tearing of the shoulder  At this point he is going to work on shoulder strengthening exercises and he will let us know if he changes his mind to have surgery  Encounter Diagnoses  Name Primary?  . Tendonitis of long head of biceps brachii of right shoulder Yes  . Nontraumatic complete tear of right rotator cuff     CLINICAL DATA:  Chronic shoulder pain and limited range of motion.   EXAM: MRI OF THE RIGHT SHOULDER WITHOUT CONTRAST   TECHNIQUE: Multiplanar, multisequence MR imaging of the shoulder was performed. No intravenous contrast was administered.   COMPARISON:  Right shoulder x-rays dated September 28, 2017.   FINDINGS: Rotator cuff: Severe supraspinatus tendinosis with focal full-thickness, partial width tear of the anterior tendon fibers near the footprint, with 1.6 cm retraction. Infraspinatus tendinosis with full-thickness, partial width tear of the anterior infraspinatus tendon with retraction to the mid humeral head. Subscapularis tendinosis without tear. The teres minor tendon is unremarkable.   Muscles:  Mild infraspinatus muscle atrophy.  No muscle edema.   Biceps long head: Moderate intra-articular tendinosis. Intact and normally positioned.   Acromioclavicular Joint: Moderate  arthropathy of the acromioclavicular joint. Type II acromion. Small subacromial/subdeltoid bursal fluid.   Glenohumeral Joint: Mild degenerative changes.  No joint effusion.   Labrum: Grossly intact, but evaluation is limited by lack of intraarticular fluid.   Bones:  No marrow abnormality, fracture or dislocation.   Other: None.   IMPRESSION: 1. Full-thickness, partial width tears of the supraspinatus and infraspinatus tendons. Mild infraspinatus muscle atrophy. 2. Subscapularis and intra-articular biceps tendinosis. 3. Moderate acromioclavicular and mild glenohumeral osteoarthritis.     Electronically Signed   By: Titus Dubin M.D.   On: 10/31/2017 09:30

## 2017-11-26 ENCOUNTER — Encounter

## 2017-11-26 ENCOUNTER — Ambulatory Visit (INDEPENDENT_AMBULATORY_CARE_PROVIDER_SITE_OTHER): Payer: PRIVATE HEALTH INSURANCE | Admitting: Neurology

## 2017-11-26 ENCOUNTER — Encounter (INDEPENDENT_AMBULATORY_CARE_PROVIDER_SITE_OTHER): Payer: PRIVATE HEALTH INSURANCE | Admitting: Neurology

## 2017-11-26 DIAGNOSIS — G629 Polyneuropathy, unspecified: Secondary | ICD-10-CM

## 2017-11-26 DIAGNOSIS — Z0289 Encounter for other administrative examinations: Secondary | ICD-10-CM

## 2017-11-26 NOTE — Patient Instructions (Signed)
Peripheral Neuropathy Peripheral neuropathy is a type of nerve damage. It affects nerves that carry signals between the spinal cord and other parts of the body. These are called peripheral nerves. With peripheral neuropathy, one nerve or a group of nerves may be damaged. What are the causes? Many things can damage peripheral nerves. For some people with peripheral neuropathy, the cause is unknown. Some causes include:  Diabetes. This is the most common cause of peripheral neuropathy.  Injury to a nerve.  Pressure or stress on a nerve that lasts a long time.  Too little vitamin B. Alcoholism can lead to this.  Infections.  Autoimmune diseases, such as multiple sclerosis and systemic lupus erythematosus.  Inherited nerve diseases.  Some medicines, such as cancer drugs.  Toxic substances, such as lead and mercury.  Too little blood flowing to the legs.  Kidney disease.  Thyroid disease.  Pre-diabetes   What are the signs or symptoms? Different people have different symptoms. The symptoms you have will depend on which of your nerves is damaged. Common symptoms include:  Loss of feeling (numbness) in the feet and hands.  Tingling in the feet and hands.  Pain that burns.  Very sensitive skin.  Weakness.  Not being able to move a part of the body (paralysis).  Muscle twitching.  Clumsiness or poor coordination.  Loss of balance.  Not being able to control your bladder.  Feeling dizzy.  Sexual problems.  How is this diagnosed? Peripheral neuropathy is a symptom, not a disease. Finding the cause of peripheral neuropathy can be hard. To figure that out, your health care provider will take a medical history and do a physical exam. A neurological exam will also be done. This involves checking things affected by your brain, spinal cord, and nerves (nervous system). For example, your health care provider will check your reflexes, how you move, and what you can  feel. Other types of tests may also be ordered, such as:  Blood tests.  A test of the fluid in your spinal cord.  Imaging tests, such as CT scans or an MRI.  Electromyography (EMG). This test checks the nerves that control muscles.  Nerve conduction velocity tests. These tests check how fast messages pass through your nerves.  Nerve biopsy. A small piece of nerve is removed. It is then checked under a microscope.  How is this treated?  Medicine is often used to treat peripheral neuropathy. Medicines may include: ? Pain-relieving medicines. Prescription or over-the-counter medicine may be suggested. ? Antiseizure medicine. This may be used for pain. ? Antidepressants. These also may help ease pain from neuropathy. ? Lidocaine. This is a numbing medicine. You might wear a patch or be given a shot. ? Mexiletine. This medicine is typically used to help control irregular heart rhythms.  Surgery. Surgery may be needed to relieve pressure on a nerve or to destroy a nerve that is causing pain.  Physical therapy to help movement.  Assistive devices to help movement. Follow these instructions at home:  Only take over-the-counter or prescription medicines as directed by your health care provider. Follow the instructions carefully for any given medicines. Do not take any other medicines without first getting approval from your health care provider.  If you have diabetes, work closely with your health care provider to keep your blood sugar under control.  If you have numbness in your feet: ? Check every day for signs of injury or infection. Watch for redness, warmth, and swelling. ? Wear padded  socks and comfortable shoes. These help protect your feet.  Do not do things that put pressure on your damaged nerve.  Do not smoke. Smoking keeps blood from getting to damaged nerves.  Avoid or limit alcohol. Too much alcohol can cause a lack of B vitamins. These vitamins are needed for healthy  nerves.  Develop a good support system. Coping with peripheral neuropathy can be stressful. Talk to a mental health specialist or join a support group if you are struggling.  Follow up with your health care provider as directed. Contact a health care provider if:  You have new signs or symptoms of peripheral neuropathy.  You are struggling emotionally from dealing with peripheral neuropathy.  You have a fever. Get help right away if:  You have an injury or infection that is not healing.  You feel very dizzy or begin vomiting.  You have chest pain.  You have trouble breathing. This information is not intended to replace advice given to you by your health care provider. Make sure you discuss any questions you have with your health care provider. Document Released: 03/28/2002 Document Revised: 09/13/2015 Document Reviewed: 12/13/2012 Elsevier Interactive Patient Education  2017 [employer].  Diabetic neuropathy presents a major public health problem. It is defined by the symptoms and signs of peripheral nerve dysfunction in diabetic patients, in whom other causes of neuropathy have been excluded. Pathogenetic mechanisms that have been implicated in diabetic neuropathy are: a) increased flux through the polyol pathway, leading to accumulation of sorbitol, a reduction in myo-inositol, and an associated reduced Na+-K+-ATPase activity, and b) endoneurial microvascular damage and hypoxia due to nitric oxide inactivation by increased oxygen free radical activity. Alpha-lipoic acid seems to delay or reverse peripheral diabetic neuropathy through its multiple antioxidant properties. Treatment with alpha-lipoic acid increases reduced glutathione, an important endogenous antioxidant. In clinical trials, 600 mg alpha-lipoic acid has been shown to improve neuropathic deficits. This review focuses on the relationship of alpha-lipoic acid and auto-oxidative glycosylation. It discusses the impact of  alpha-lipoic acid on hyperglycemia-induced oxidative stress, and examines the role of alpha-lipoic acid in preventing glycation process and nerve hypoxia.

## 2017-11-30 NOTE — Progress Notes (Signed)
There is some mild changes consistent with neuropathy We can discuss this further when the patient follows up in early September

## 2017-11-30 NOTE — Progress Notes (Signed)
Full Name: [name] [name] Gender: Male MRN #: [id number] Date of Birth: [birth year]    Visit Date: 11/26/17 08:12 Age: 62 Years 5 Months Old Examining Physician: Sarina Ill, MD  Referring Physician: Sallee Lange, MD  History: Sensory changes in the feet    Summary: EMG/NCS performed on the bilateral lower extremities  The right sural sensory nerve showed delayed distal peak latency (4.7 ms, N<4.4) and reduced amplitude (4 V, N>6). The left sural sensory nerve showed delayed distal peak latency (4.6 ms, N<4.4) and reduced amplitude (3 V, N>6). The right superficial peroneal sensory nerve showed delayed distal peak latency (4.5 ms, N<4.4) and reduced amplitude (4 V, N>6). The right superficial peroneal sensory nerve showed delayed distal peak latency (4.6 ms, N<4.4) and reduced amplitude (3 V, N>6).  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   Sarina Ill, M.D.  [facility] [place], [place] [address] Tel: [phone] Fax: [phone]        [facility]    Nerve / Sites Muscle Latency Ref. Amplitude Ref. Rel Amp Segments Distance Velocity Ref. Area    ms ms mV mV %  cm m/s m/s mVms  R Peroneal - EDB     Ankle EDB 5.4 ?6.5 4.6 ?2.0 100 Ankle - EDB 9   11.7     Fib head EDB 13.1  4.4  96.6 Fib head - Ankle 37 48 ?44 13.0     Pop fossa EDB 15.3  4.3  96.9 Pop fossa - Fib head 10 46 ?44 12.9         Pop fossa - Ankle      L Peroneal - EDB     Ankle EDB 4.8 ?6.5 4.5 ?2.0 100 Ankle - EDB 9   12.5     Fib head EDB 13.2  4.0  88.6 Fib head - Ankle 37 44 ?44 12.3     Pop fossa EDB 15.4  3.7  91.7 Pop fossa - Fib head 10 45 ?44 12.1         Pop fossa - Ankle      R Tibial - AH     Ankle AH 4.9 ?5.8 4.5 ?4.0 100 Ankle - AH 9   9.7     Pop fossa AH 16.0  3.1  69.3 Pop fossa - Ankle 47 43 ?41 8.2  L Tibial - AH     Ankle AH 5.0 ?5.8 6.6 ?4.0 100 Ankle - AH 9   14.3     Pop fossa AH 16.5  3.9  58.5 Pop fossa - Ankle 47 41 ?41 10.6               SNC    Nerve / Sites Rec. Site Peak Lat Ref.  Amp Ref. Segments Distance    ms ms V V  cm  R Sural - Ankle (Calf)     Calf Ankle 4.7 ?4.4 4 ?6 Calf - Ankle 14  L Sural - Ankle (Calf)     Calf Ankle 4.6 ?4.4 3 ?6 Calf - Ankle 14  R Superficial peroneal - Ankle     Lat leg Ankle 4.5 ?4.4 4 ?6 Lat leg - Ankle 14  L Superficial peroneal - Ankle     Lat leg Ankle 4.6 ?4.4 3 ?6 Lat leg - Ankle 14              F  Wave    Nerve F Lat Ref.  ms ms  R Tibial - AH 55.9 ?56.0  L Tibial - AH 55.7 ?56.0         EMG full       EMG Summary Table    Spontaneous MUAP Recruitment  Muscle IA Fib PSW Fasc Other Amp Dur. Poly Pattern  L. Vastus medialis Normal None None None _______ Normal Normal Normal Normal  L. Tibialis anterior Normal None None None _______ Normal Normal Normal Normal  L. Abductor hallucis Normal None None None _______ Normal Normal Normal Normal  L. Gastrocnemius (Medial head) Normal None None None _______ Normal Normal Normal Normal  L. Extensor hallucis longus Normal None None None _______ Normal Normal Normal Normal

## 2017-11-30 NOTE — Procedures (Signed)
Full Name: [name] [name] Gender: Male MRN #: [id number] Date of Birth: 2056-01-02    Visit Date: 11/26/17 08:12 Age: 62 Years 28 Months Old Examining Physician: Sarina Ill, MD  Referring Physician: Sallee Lange, MD  History: Sensory changes in the feet    Summary: EMG/NCS performed on the bilateral lower extremities  The right sural sensory nerve showed delayed distal peak latency (4.7 ms, N<4.4) and reduced amplitude (4 V, N>6). The left sural sensory nerve showed delayed distal peak latency (4.6 ms, N<4.4) and reduced amplitude (3 V, N>6). The right superficial peroneal sensory nerve showed delayed distal peak latency (4.5 ms, N<4.4) and reduced amplitude (4 V, N>6). The right superficial peroneal sensory nerve showed delayed distal peak latency (4.6 ms, N<4.4) and reduced amplitude (3 V, N>6).  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   Cc: Dr. Lavina Hamman, M.D.  [facility] [place], [place] [address] Tel: [phone] Fax: [phone]        [facility]    Nerve / Sites Muscle Latency Ref. Amplitude Ref. Rel Amp Segments Distance Velocity Ref. Area    ms ms mV mV %  cm m/s m/s mVms  R Peroneal - EDB     Ankle EDB 5.4 ?6.5 4.6 ?2.0 100 Ankle - EDB 9   11.7     Fib head EDB 13.1  4.4  96.6 Fib head - Ankle 37 48 ?44 13.0     Pop fossa EDB 15.3  4.3  96.9 Pop fossa - Fib head 10 46 ?44 12.9         Pop fossa - Ankle      L Peroneal - EDB     Ankle EDB 4.8 ?6.5 4.5 ?2.0 100 Ankle - EDB 9   12.5     Fib head EDB 13.2  4.0  88.6 Fib head - Ankle 37 44 ?44 12.3     Pop fossa EDB 15.4  3.7  91.7 Pop fossa - Fib head 10 45 ?44 12.1         Pop fossa - Ankle      R Tibial - AH     Ankle AH 4.9 ?5.8 4.5 ?4.0 100 Ankle - AH 9   9.7     Pop fossa AH 16.0  3.1  69.3 Pop fossa - Ankle 47 43 ?41 8.2  L Tibial - AH     Ankle AH 5.0 ?5.8 6.6 ?4.0 100 Ankle - AH 9   14.3     Pop fossa AH 16.5  3.9  58.5 Pop fossa -  Ankle 47 41 ?41 10.6             SNC    Nerve / Sites Rec. Site Peak Lat Ref.  Amp Ref. Segments Distance    ms ms V V  cm  R Sural - Ankle (Calf)     Calf Ankle 4.7 ?4.4 4 ?6 Calf - Ankle 14  L Sural - Ankle (Calf)     Calf Ankle 4.6 ?4.4 3 ?6 Calf - Ankle 14  R Superficial peroneal - Ankle     Lat leg Ankle 4.5 ?4.4 4 ?6 Lat leg - Ankle 14  L Superficial peroneal - Ankle     Lat leg Ankle 4.6 ?4.4 3 ?6 Lat leg - Ankle 14              F  Wave    Nerve  F Lat Ref.   ms ms  R Tibial - AH 55.9 ?56.0  L Tibial - AH 55.7 ?56.0         EMG full       EMG Summary Table    Spontaneous MUAP Recruitment  Muscle IA Fib PSW Fasc Other Amp Dur. Poly Pattern  L. Vastus medialis Normal None None None _______ Normal Normal Normal Normal  L. Tibialis anterior Normal None None None _______ Normal Normal Normal Normal  L. Abductor hallucis Normal None None None _______ Normal Normal Normal Normal  L. Gastrocnemius (Medial head) Normal None None None _______ Normal Normal Normal Normal  L. Extensor hallucis longus Normal None None None _______ Normal Normal Normal Normal

## 2017-12-01 NOTE — Progress Notes (Signed)
Left message to return call 

## 2017-12-01 NOTE — Progress Notes (Signed)
Pt returned call and verbalized understanding  

## 2017-12-02 ENCOUNTER — Other Ambulatory Visit: Payer: Self-pay | Admitting: Family Medicine

## 2017-12-19 LAB — BASIC METABOLIC PANEL WITH GFR
BUN/Creatinine Ratio: 15 (ref 10–24)
BUN: 21 mg/dL (ref 8–27)
CO2: 27 mmol/L (ref 20–29)
Calcium: 8.9 mg/dL (ref 8.6–10.2)
Chloride: 102 mmol/L (ref 96–106)
Creatinine, Ser: 1.4 mg/dL — ABNORMAL HIGH (ref 0.76–1.27)
GFR calc Af Amer: 61 mL/min/1.73 (ref >59)
GFR calc non Af Amer: 53 mL/min/1.73 — ABNORMAL LOW (ref >59)
Glucose: 96 mg/dL (ref 65–99)
Potassium: 3.3 mmol/L — ABNORMAL LOW (ref 3.5–5.2)
Sodium: 144 mmol/L (ref 134–144)

## 2017-12-19 LAB — HEMOGLOBIN A1C
Est. average glucose Bld gHb Est-mCnc: 126 mg/dL
Hgb A1c MFr Bld: 6 % — ABNORMAL HIGH (ref 4.8–5.6)

## 2017-12-19 LAB — LIPID PANEL
CHOL/HDL RATIO: 3.1 ratio (ref 0.0–5.0)
Cholesterol, Total: 147 mg/dL (ref 100–199)
HDL: 47 mg/dL (ref >39)
LDL CALC: 87 mg/dL (ref 0–99)
TRIGLYCERIDES: 67 mg/dL (ref 0–149)
VLDL CHOLESTEROL CAL: 13 mg/dL (ref 5–40)

## 2017-12-23 ENCOUNTER — Encounter: Payer: Self-pay | Admitting: Family Medicine

## 2017-12-23 ENCOUNTER — Ambulatory Visit: Payer: PRIVATE HEALTH INSURANCE | Admitting: Family Medicine

## 2017-12-23 VITALS — BP 122/82 | Ht 75.0 in | Wt 256.2 lb

## 2017-12-23 DIAGNOSIS — I10 Essential (primary) hypertension: Secondary | ICD-10-CM | POA: Diagnosis not present

## 2017-12-23 DIAGNOSIS — N289 Disorder of kidney and ureter, unspecified: Secondary | ICD-10-CM

## 2017-12-23 DIAGNOSIS — M719 Bursopathy, unspecified: Secondary | ICD-10-CM | POA: Diagnosis not present

## 2017-12-23 DIAGNOSIS — E876 Hypokalemia: Secondary | ICD-10-CM

## 2017-12-23 DIAGNOSIS — M67919 Unspecified disorder of synovium and tendon, unspecified shoulder: Secondary | ICD-10-CM

## 2017-12-23 MED ORDER — LOSARTAN POTASSIUM 100 MG PO TABS: ORAL_TABLET | ORAL | 5 refills | Status: DC

## 2017-12-23 MED ORDER — INDAPAMIDE 2.5 MG PO TABS: 2.5000 mg | ORAL_TABLET | Freq: Every day | ORAL | 5 refills | Status: DC

## 2017-12-23 MED ORDER — NIFEDIPINE ER OSMOTIC RELEASE 90 MG PO TB24: 90.0000 mg | ORAL_TABLET | Freq: Every day | ORAL | 5 refills | Status: DC

## 2017-12-23 MED ORDER — ROSUVASTATIN CALCIUM 20 MG PO TABS: ORAL_TABLET | ORAL | 5 refills | Status: DC

## 2017-12-23 MED ORDER — DOXAZOSIN MESYLATE 4 MG PO TABS: 4.0000 mg | ORAL_TABLET | Freq: Every day | ORAL | 5 refills | Status: DC

## 2017-12-23 MED ORDER — POTASSIUM CHLORIDE CRYS ER 20 MEQ PO TBCR: EXTENDED_RELEASE_TABLET | ORAL | 5 refills | Status: DC

## 2017-12-23 MED ORDER — METOPROLOL SUCCINATE ER 50 MG PO TB24: 50.0000 mg | ORAL_TABLET | Freq: Every day | ORAL | 5 refills | Status: DC

## 2017-12-23 NOTE — Progress Notes (Signed)
   Subjective:    Patient ID: [name] [name], male    DOB: 05-Nov-1955, 62 y.o.   MRN: [id number]  Hypertension  This is a chronic problem. Pertinent negatives include no chest pain, headaches or shortness of breath. There are no compliance problems.    Pt does check BP at home sometime. Pt needs proof of his physical in April.   Patient does try to do the best he can at watching his diet tries to stay physically active does do some weight lifting.  In addition to this patient does a good job of minimizing salt intake in his medicines  He does have renal insufficiency we talked at length about this  Patient also has prediabetes very important for him to keep this under control by diet alone and exercise.  Monitor blood work periodically lab work reviewed with patient today Review of Systems  Constitutional: Negative for diaphoresis and fatigue.  HENT: Negative for congestion and rhinorrhea.   Respiratory: Negative for cough and shortness of breath.   Cardiovascular: Negative for chest pain and leg swelling.  Gastrointestinal: Negative for abdominal pain and diarrhea.  Skin: Negative for color change and rash.  Neurological: Negative for dizziness and headaches.  Psychiatric/Behavioral: Negative for behavioral problems and confusion.       Objective:   Physical Exam  Constitutional: He appears well-nourished. No distress.  HENT:  Head: Normocephalic and atraumatic.  Eyes: Right eye exhibits no discharge. Left eye exhibits no discharge.  Neck: No tracheal deviation present.  Cardiovascular: Normal rate, regular rhythm and normal heart sounds.  No murmur heard. Pulmonary/Chest: Effort normal and breath sounds normal. No respiratory distress.  Musculoskeletal: He exhibits no edema.  Lymphadenopathy:    He has no cervical adenopathy.  Neurological: He is alert. Coordination normal.  Skin: Skin is warm and dry.  Psychiatric: He has a normal mood and affect. His behavior is normal.    Vitals reviewed.  Patient's potassium slightly low adjusted up on his potassium recheck up metabolic 7 in 1 month       Assessment & Plan:  HTN- Patient was seen today as part of a visit regarding hypertension. The importance of healthy diet and regular physical activity was discussed. The importance of compliance with medications discussed.  Ideal goal is to keep blood pressure low elevated levels certainly below 124/58 when possible.  The patient was counseled that keeping blood pressure under control lessen his risk of complications.  The importance of regular follow-ups was discussed with the patient.  Low-salt diet such as DASH recommended.  Regular physical activity was recommended as well.  Patient was advised to keep regular follow-ups.   Mild renal insufficiency.  Very important to avoid anti-inflammatories.  Minimize protein.  Take blood pressure medicine regular basis  Right shoulder arthritic issues along with rotator cuff tear of 2 tendons currently is doing physical activity to try to help this possibly have to do surgery down the road

## 2018-01-14 ENCOUNTER — Other Ambulatory Visit: Payer: Self-pay | Admitting: Family Medicine

## 2018-01-16 LAB — BASIC METABOLIC PANEL
BUN/Creatinine Ratio: 13 (ref 10–24)
BUN: 20 mg/dL (ref 8–27)
CO2: 25 mmol/L (ref 20–29)
CREATININE: 1.51 mg/dL — AB (ref 0.76–1.27)
Calcium: 9.2 mg/dL (ref 8.6–10.2)
Chloride: 104 mmol/L (ref 96–106)
GFR calc Af Amer: 56 mL/min/{1.73_m2} — ABNORMAL LOW (ref >59)
GFR, EST NON AFRICAN AMERICAN: 48 mL/min/{1.73_m2} — AB (ref >59)
Glucose: 119 mg/dL — ABNORMAL HIGH (ref 65–99)
Potassium: 3.2 mmol/L — ABNORMAL LOW (ref 3.5–5.2)
SODIUM: 145 mmol/L — AB (ref 134–144)

## 2018-01-19 ENCOUNTER — Encounter: Payer: Self-pay | Admitting: Family Medicine

## 2018-01-19 ENCOUNTER — Telehealth: Payer: Self-pay | Admitting: Family Medicine

## 2018-01-19 NOTE — Telephone Encounter (Signed)
Nurses Look at lab results message : His new dose of indapamide Please notify patient

## 2018-01-19 NOTE — Telephone Encounter (Signed)
Nurses Please see my chart message regarding this patient The message is playing [name] [name] Please look at lab result message as well If you should have questions please discuss with me personally thank you

## 2018-01-19 NOTE — Telephone Encounter (Signed)
Nurses-this gets routed back to me  Please review lab result message Send in the new dose of indapamide which is 1.25 mg every morning Please notify patient Please repeat met 7 in 1 month

## 2018-01-20 ENCOUNTER — Other Ambulatory Visit: Payer: Self-pay | Admitting: *Deleted

## 2018-01-20 DIAGNOSIS — Z79899 Other long term (current) drug therapy: Secondary | ICD-10-CM

## 2018-01-20 DIAGNOSIS — I10 Essential (primary) hypertension: Secondary | ICD-10-CM

## 2018-01-20 MED ORDER — POTASSIUM CHLORIDE CRYS ER 20 MEQ PO TBCR: EXTENDED_RELEASE_TABLET | ORAL | 5 refills | Status: DC

## 2018-01-20 MED ORDER — INDAPAMIDE 1.25 MG PO TABS: 1.2500 mg | ORAL_TABLET | Freq: Every day | ORAL | 5 refills | Status: DC

## 2018-01-20 NOTE — Telephone Encounter (Signed)
Left message to return call - also sent him a mychart message.

## 2018-01-21 NOTE — Telephone Encounter (Signed)
Discussed with pt. Pt verbalized understanding. meds with new directions sent to pharm.

## 2018-01-22 ENCOUNTER — Ambulatory Visit: Payer: PRIVATE HEALTH INSURANCE | Admitting: Family Medicine

## 2018-02-19 ENCOUNTER — Telehealth: Payer: Self-pay | Admitting: Family Medicine

## 2018-02-19 DIAGNOSIS — I10 Essential (primary) hypertension: Secondary | ICD-10-CM

## 2018-02-19 LAB — BASIC METABOLIC PANEL
BUN/Creatinine Ratio: 11 (ref 10–24)
BUN: 20 mg/dL (ref 8–27)
CALCIUM: 8.9 mg/dL (ref 8.6–10.2)
CO2: 25 mmol/L (ref 20–29)
CREATININE: 1.74 mg/dL — AB (ref 0.76–1.27)
Chloride: 103 mmol/L (ref 96–106)
GFR calc Af Amer: 47 mL/min/{1.73_m2} — ABNORMAL LOW (ref >59)
GFR, EST NON AFRICAN AMERICAN: 41 mL/min/{1.73_m2} — AB (ref >59)
Glucose: 82 mg/dL (ref 65–99)
POTASSIUM: 3.5 mmol/L (ref 3.5–5.2)
Sodium: 144 mmol/L (ref 134–144)

## 2018-02-19 NOTE — Telephone Encounter (Signed)
Please advise. Thank you

## 2018-02-19 NOTE — Telephone Encounter (Signed)
Pt dropped off BP readings. Placed in box in office for review.

## 2018-02-22 NOTE — Telephone Encounter (Signed)
I am concerned about the patient's blood pressure readings Please let him know that I did review these Too many of his numbers are elevated Please also see his lab result I am recommending for consultation with nephrology They can give [place] additional input regarding his kidney function and blood pressure I also think it would be reasonable for the patient to do a follow-up office visit with me in the next couple weeks we may need to add additional medicine

## 2018-02-23 NOTE — Telephone Encounter (Signed)
I called and left a message asked that pt r/c.Order for Nephrology placed in [place].

## 2018-02-23 NOTE — Telephone Encounter (Signed)
Patient advised Dr Nicki Reaper is concerned about the patient's blood pressure readings Please let him know that I did review these Too many of his numbers are elevated Please also see his lab result Dr Nicki Reaper is recommending for consultation with nephrology They can give [place] additional input regarding his kidney function and blood pressure Dr Nicki Reaper also thinks it would be reasonable for the patient to do a follow-up office visit with me-we may need to add additional medicine. Patient verbalized understanding and scheduled follow up office visit for blood pressure.

## 2018-02-25 ENCOUNTER — Encounter: Payer: Self-pay | Admitting: Family Medicine

## 2018-02-25 ENCOUNTER — Ambulatory Visit: Payer: PRIVATE HEALTH INSURANCE | Admitting: Family Medicine

## 2018-02-25 VITALS — BP 138/92 | Ht 75.0 in | Wt 267.1 lb

## 2018-02-25 DIAGNOSIS — I10 Essential (primary) hypertension: Secondary | ICD-10-CM | POA: Diagnosis not present

## 2018-02-25 NOTE — Progress Notes (Signed)
   Subjective:    Patient ID: [name] [name], male    DOB: Aug 02, 1955, 62 y.o.   MRN: [id number]  HPI   Patient is here today with elevated bp. Patient takes Cardura 4 mg QHS, Cozaar 100 mg per day, Metoprolol 50 one per day, Procardia 90 mg per day.He states he is having some issues with hypokalemia. Patient's blood pressure is been elevated on several different accounts other times it seems to be doing well he is taking his medicines as directed he does try to watch his diet he does exercise although more of his exercises weightlifting and less of his cardio he does try to avoid excessive protein intake and states he does try to minimize salt  Review of Systems  Constitutional: Negative for activity change, fatigue and fever.  HENT: Negative for congestion and rhinorrhea.   Respiratory: Negative for cough and shortness of breath.   Cardiovascular: Negative for chest pain and leg swelling.  Gastrointestinal: Negative for abdominal pain, diarrhea and nausea.  Genitourinary: Negative for dysuria and hematuria.  Neurological: Negative for weakness and headaches.  Psychiatric/Behavioral: Negative for agitation and behavioral problems.       Objective:   Physical Exam  Constitutional: He appears well-nourished. No distress.  HENT:  Head: Normocephalic and atraumatic.  Eyes: Right eye exhibits no discharge. Left eye exhibits no discharge.  Neck: No tracheal deviation present.  Cardiovascular: Normal rate, regular rhythm and normal heart sounds.  No murmur heard. Pulmonary/Chest: Effort normal and breath sounds normal. No respiratory distress.  Musculoskeletal: He exhibits no edema.  Lymphadenopathy:    He has no cervical adenopathy.  Neurological: He is alert. Coordination normal.  Skin: Skin is warm and dry.  Psychiatric: He has a normal mood and affect. His behavior is normal.  Vitals reviewed.         Assessment & Plan:  HTN subpar control intermittently Currently today  blood pressure under good control On multiple medicines  Creatinine is going up This begs the question if there is an underlying issue such as renal artery stenosis given the resistant nature of his blood pressure he will monitor his blood pressures ongoing he will continue his medicines in addition to this patient will be seen by [facility] for further evaluation of chronic kidney disease and high blood pressure  Patient is aware that he may need further testing to help delineate his issue  We will continue to be his family medicine doctor ongoing as he understands

## 2018-06-13 ENCOUNTER — Other Ambulatory Visit: Payer: Self-pay | Admitting: Family Medicine

## 2018-06-16 ENCOUNTER — Encounter: Payer: Self-pay | Admitting: Family Medicine

## 2018-06-23 ENCOUNTER — Encounter: Payer: Self-pay | Admitting: Family Medicine

## 2018-06-23 ENCOUNTER — Ambulatory Visit: Payer: PRIVATE HEALTH INSURANCE | Admitting: Family Medicine

## 2018-06-23 VITALS — BP 126/86 | Ht 75.0 in | Wt 269.1 lb

## 2018-06-23 DIAGNOSIS — Z0000 Encounter for general adult medical examination without abnormal findings: Secondary | ICD-10-CM | POA: Diagnosis not present

## 2018-06-23 DIAGNOSIS — N289 Disorder of kidney and ureter, unspecified: Secondary | ICD-10-CM

## 2018-06-23 DIAGNOSIS — E782 Mixed hyperlipidemia: Secondary | ICD-10-CM

## 2018-06-23 DIAGNOSIS — I10 Essential (primary) hypertension: Secondary | ICD-10-CM | POA: Diagnosis not present

## 2018-06-23 DIAGNOSIS — Z79899 Other long term (current) drug therapy: Secondary | ICD-10-CM

## 2018-06-23 DIAGNOSIS — R7989 Other specified abnormal findings of blood chemistry: Secondary | ICD-10-CM

## 2018-06-23 DIAGNOSIS — Z8546 Personal history of malignant neoplasm of prostate: Secondary | ICD-10-CM

## 2018-06-23 MED ORDER — ZOSTER VAC RECOMB ADJUVANTED 50 MCG/0.5ML IM SUSR: 0.5000 mL | Freq: Once | INTRAMUSCULAR | 1 refills | Status: AC

## 2018-06-23 NOTE — Patient Instructions (Signed)
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
## 2018-06-24 LAB — BASIC METABOLIC PANEL
BUN/Creatinine Ratio: 13 (ref 10–24)
BUN: 20 mg/dL (ref 8–27)
CO2: 28 mmol/L (ref 20–29)
Calcium: 9.1 mg/dL (ref 8.6–10.2)
Chloride: 103 mmol/L (ref 96–106)
Creatinine, Ser: 1.59 mg/dL — ABNORMAL HIGH (ref 0.76–1.27)
GFR calc Af Amer: 53 mL/min/{1.73_m2} — ABNORMAL LOW (ref >59)
GFR, EST NON AFRICAN AMERICAN: 46 mL/min/{1.73_m2} — AB (ref >59)
Glucose: 96 mg/dL (ref 65–99)
POTASSIUM: 4 mmol/L (ref 3.5–5.2)
SODIUM: 145 mmol/L — AB (ref 134–144)

## 2018-06-24 LAB — TESTOSTERONE: TESTOSTERONE: 405 ng/dL (ref 264–916)

## 2018-06-24 LAB — HEPATIC FUNCTION PANEL
ALBUMIN: 4.1 g/dL (ref 3.8–4.8)
ALT: 20 IU/L (ref 0–44)
AST: 19 IU/L (ref 0–40)
Alkaline Phosphatase: 120 IU/L — ABNORMAL HIGH (ref 39–117)
BILIRUBIN TOTAL: 0.4 mg/dL (ref 0.0–1.2)
BILIRUBIN, DIRECT: 0.08 mg/dL (ref 0.00–0.40)
Total Protein: 6.6 g/dL (ref 6.0–8.5)

## 2018-06-24 LAB — LIPID PANEL
CHOL/HDL RATIO: 2.7 ratio (ref 0.0–5.0)
Cholesterol, Total: 158 mg/dL (ref 100–199)
HDL: 58 mg/dL (ref >39)
LDL Calculated: 90 mg/dL (ref 0–99)
Triglycerides: 48 mg/dL (ref 0–149)
VLDL Cholesterol Cal: 10 mg/dL (ref 5–40)

## 2018-06-24 LAB — PSA

## 2018-07-08 ENCOUNTER — Encounter: Payer: Self-pay | Admitting: Family Medicine

## 2018-07-13 ENCOUNTER — Other Ambulatory Visit: Payer: Self-pay | Admitting: Family Medicine

## 2018-08-12 ENCOUNTER — Other Ambulatory Visit: Payer: Self-pay | Admitting: Family Medicine

## 2018-09-03 ENCOUNTER — Encounter: Payer: Self-pay | Admitting: Family Medicine

## 2018-09-17 ENCOUNTER — Other Ambulatory Visit: Payer: Self-pay | Admitting: Family Medicine

## 2018-10-02 ENCOUNTER — Encounter: Payer: Self-pay | Admitting: Family Medicine

## 2018-10-16 ENCOUNTER — Other Ambulatory Visit: Payer: Self-pay | Admitting: Family Medicine

## 2018-10-25 ENCOUNTER — Other Ambulatory Visit: Payer: Self-pay

## 2018-10-25 ENCOUNTER — Encounter: Payer: Self-pay | Admitting: Orthopedic Surgery

## 2018-10-25 ENCOUNTER — Ambulatory Visit (INDEPENDENT_AMBULATORY_CARE_PROVIDER_SITE_OTHER): Payer: PRIVATE HEALTH INSURANCE | Admitting: Orthopedic Surgery

## 2018-10-25 VITALS — BP 135/76 | HR 82 | Temp 99.1°F | Ht 75.0 in | Wt 264.0 lb

## 2018-10-25 DIAGNOSIS — M67912 Unspecified disorder of synovium and tendon, left shoulder: Secondary | ICD-10-CM | POA: Diagnosis not present

## 2018-10-25 DIAGNOSIS — M67911 Unspecified disorder of synovium and tendon, right shoulder: Secondary | ICD-10-CM | POA: Diagnosis not present

## 2018-10-25 MED ORDER — PREDNISONE 10 MG (48) PO TBPK: ORAL_TABLET | Freq: Every day | ORAL | 0 refills | Status: DC

## 2018-10-25 NOTE — Progress Notes (Signed)
[name] [name]  10/25/2018  HISTORY SECTION :  Chief Complaint  Patient presents with  . Shoulder Pain    bilateral    HPI The patient presents for evaluation of bilateral shoulder pain left greater than right has severe discomfort at night and left shoulder which gets cold  He has weakness in the right shoulder with weak abduction and flexion  No history of trauma recently has done well with injections in the past   Location both shoulders Duration chronic several years Quality cold feeling on the left dull pain on the right  Mild discomfort at night on the left none on the right  associated with weakness right arm  Review of Systems  Respiratory: Negative for shortness of breath.   Cardiovascular: Negative for chest pain.     Past Medical History:  Diagnosis Date  . Adenomatous colon polyp 2007 [place]  . DDD (degenerative disc disease)   . GERD (gastroesophageal reflux disease)   . Herniated disc    Back  . HTN (hypertension)   . Hyperlipemia   . Prostate CA [facility]) 2004    Past Surgical History:  Procedure Laterality Date  . COLONOSCOPY  11/15/2010   Dr. Fields:Pedunculated polyp/pan-colonic  TICS/mod IH. tubular adenoma  . COLONOSCOPY  2007   Dr. Oneida Alar: 3 mm tubular adeoma  . COLONOSCOPY N/A 03/08/2014   Procedure: COLONOSCOPY;  Surgeon: Danie Binder, MD;  [facility];  Service: Endoscopy;  Laterality: N/A;  100 - moved to 12:45 - [place] to notify pt  . COLONOSCOPY N/A 10/17/2016   Procedure: COLONOSCOPY;  Surgeon: Danie Binder, MD;  Location: [facility];  Service: Endoscopy;  Laterality: N/A;  9:00am  . KNEE ARTHROSCOPY W/ MENISCECTOMY  2011 RIGHT  . LAMINECTOMY AND MICRODISCECTOMY LUMBAR SPINE  2002  . PROSTATECTOMY       No Known Allergies   Current Outpatient Medications:  .  b complex vitamins capsule, Take 1 capsule by mouth daily., Disp: , Rfl:  .  cetirizine (ZYRTEC) 10 MG tablet, TAKE ONE (1) TABLET BY MOUTH EVERY DAY, Disp: 30  tablet, Rfl: 0 .  Cholecalciferol (VITAMIN D PO), Take by mouth daily. Patient unsure of dose, Disp: , Rfl:  .  doxazosin (CARDURA) 4 MG tablet, TAKE 1 TABLET(4 MG) BY MOUTH AT BEDTIME, Disp: 30 tablet, Rfl: 5 .  indapamide (LOZOL) 1.25 MG tablet, TAKE 1 TABLET(1.25 MG) BY MOUTH DAILY, Disp: 30 tablet, Rfl: 2 .  losartan (COZAAR) 100 MG tablet, TAKE 1 TABLET BY MOUTH EVERY DAY, Disp: 30 tablet, Rfl: 2 .  metoprolol succinate (TOPROL-XL) 50 MG 24 hr tablet, TAKE 1 TABLET BY MOUTH EVERY DAY IMMEDIATELY FOLLOWING A MEAL, Disp: 30 tablet, Rfl: 2 .  Multiple Vitamin (MULTIVITAMIN) capsule, Take 1 capsule by mouth daily.  , Disp: , Rfl:  .  NIFEdipine (PROCARDIA XL/NIFEDICAL-XL) 90 MG 24 hr tablet, TAKE 1 TABLET(90 MG) BY MOUTH DAILY, Disp: 30 tablet, Rfl: 2 .  Omega-3 Fatty Acids (FISH OIL) 1200 MG CAPS, Take by mouth daily., Disp: , Rfl:  .  Omeprazole 20 MG TBEC, TAKE 1 TABLET BY MOUTH EVERY DAY, Disp: 30 tablet, Rfl: 5 .  potassium chloride SA (K-DUR) 20 MEQ tablet, TAKE 3 TABLETS BY MOUTH EVERY EVENING AND EVERY MORNING, Disp: 180 tablet, Rfl: 5 .  rosuvastatin (CRESTOR) 20 MG tablet, TAKE 1 TABLET BY MOUTH EVERY DAY, Disp: 30 tablet, Rfl: 5 .  predniSONE (STERAPRED UNI-PAK 48 TAB) 10 MG (48) TBPK tablet, Take by  mouth daily., Disp: 48 tablet, Rfl: 0   PHYSICAL EXAM SECTION: 1) BP 135/76   Pulse 82   Temp 99.1 F (37.3 C)   Ht 6\' 3"  (1.905 m)   Wt 264 lb (119.7 kg)   BMI 33.00 kg/m   Body mass index is 33 kg/m. General appearance: Well-developed well-nourished no gross deformities  2) Cardiovascular normal pulse and perfusion in all 4 extremities normal color without edema  3) Neurologically deep tendon reflexes are equal and normal, no sensation loss or deficits no pathologic reflexes  4) Psychological: Awake alert and oriented x3 mood and affect normal  5) Skin no lacerations or ulcerations no nodularity no palpable masses, no erythema or nodularity  6) Musculoskeletal: Left  shoulder abduction is normal right shoulder he has abduction weakness he has full range of motion of both shoulders painful range of motion throughout the arc of 105 260 degrees  Tenderness over the left and right shoulder more left than right   MEDICAL DECISION SECTION:  Encounter Diagnoses  Name Primary?  . Rotator cuff dysfunction, right Yes  . Rotator cuff dysfunction, left     Imaging CLINICAL DATA:  Chronic shoulder pain and limited range of motion.   EXAM: MRI OF THE RIGHT SHOULDER WITHOUT CONTRAST   TECHNIQUE: Multiplanar, multisequence MR imaging of the shoulder was performed. No intravenous contrast was administered.   COMPARISON:  Right shoulder x-rays dated September 28, 2017.   FINDINGS: Rotator cuff: Severe supraspinatus tendinosis with focal full-thickness, partial width tear of the anterior tendon fibers near the footprint, with 1.6 cm retraction. Infraspinatus tendinosis with full-thickness, partial width tear of the anterior infraspinatus tendon with retraction to the mid humeral head. Subscapularis tendinosis without tear. The teres minor tendon is unremarkable.   Muscles:  Mild infraspinatus muscle atrophy.  No muscle edema.   Biceps long head: Moderate intra-articular tendinosis. Intact and normally positioned.   Acromioclavicular Joint: Moderate arthropathy of the acromioclavicular joint. Type II acromion. Small subacromial/subdeltoid bursal fluid.   Glenohumeral Joint: Mild degenerative changes.  No joint effusion.   Labrum: Grossly intact, but evaluation is limited by lack of intraarticular fluid.   Bones:  No marrow abnormality, fracture or dislocation.   Other: None.   IMPRESSION: 1. Full-thickness, partial width tears of the supraspinatus and infraspinatus tendons. Mild infraspinatus muscle atrophy. 2. Subscapularis and intra-articular biceps tendinosis. 3. Moderate acromioclavicular and mild glenohumeral osteoarthritis.      Electronically Signed   By: Titus Dubin M.D.   On: 10/31/2017 09:30    RADIOLOGY REPORT*   Clinical Data:  Left shoulder pain.   MRI OF THE LEFT SHOULDER WITHOUT CONTRAST   Technique:  Multiplanar, multisequence MR imaging of the left shoulder was performed.  No intravenous contrast was administered.   Comparison:  Radiographs 05/05/2008.   FINDINGS: Rotator cuff:  Full-thickness retracted supraspinatus tendon tear anteriorly.  There is approximately 13 mm of retraction and the tear is 14 mm wide.  Moderate infraspinatus tendinopathy/tendinosis with interstitial tears.  The subscapularis tendon is intact. Muscles:  Normal. Biceps long head:  Intact.   Acromioclavicular Joint:  Moderate to advanced degenerative changes.  The acromion is type 1-2 in shape.  No lateral downsloping but mild undersurface spurring. Glenohumeral Joint:  Mild degenerative changes.  No joint effusion.   Labrum:  Mild degenerative changes but no discrete tear. Bones:  No acute bony findings.   IMPRESSION:   1.  Small full-thickness retracted supraspinatus tendon tear anteriorly.  There is 13 mm of  retraction and the tear is 14 mm wide. 2.  Moderate infraspinatus tendinopathy. 3.  Intact biceps tendon and glenoid labra. 4.  Moderate to advanced AC joint degenerative changes and mild undersurface spurring.      Plan:  (Rx., Inj., surg., Frx, MRI/CT, XR:2)  I discussed with him possibility of repairing his shoulders because the tears will get bigger last time we injected him he got better we did have to do his right shoulder but again the stairs progress so he is thinking about surgery next year  Follow-up in a year  Procedure injection right shoulder subacromial joint and left shoulder subacromial joint   procedure note the subacromial injection shoulder RIGHT  Verbal consent was obtained to inject the  RIGHT   Shoulder  Timeout was completed to confirm the injection site is a  subacromial space of the  RIGHT  shoulder   Medication used Depo-Medrol 40 mg and lidocaine 1% 3 cc  Anesthesia was provided by ethyl chloride  The injection was performed in the RIGHT  posterior subacromial space. After pinning the skin with alcohol and anesthetized the skin with ethyl chloride the subacromial space was injected using a 20-gauge needle. There were no complications  Sterile dressing was applied.    Procedure note the subacromial injection shoulder left   Verbal consent was obtained to inject the  Left   Shoulder  Timeout was completed to confirm the injection site is a subacromial space of the  left  shoulder  Medication used Depo-Medrol 40 mg and lidocaine 1% 3 cc  Anesthesia was provided by ethyl chloride  The injection was performed in the left  posterior subacromial space. After pinning the skin with alcohol and anesthetized the skin with ethyl chloride the subacromial space was injected using a 20-gauge needle. There were no complications  Sterile dressing was applied.  3:40 PM

## 2018-11-15 ENCOUNTER — Other Ambulatory Visit: Payer: Self-pay

## 2018-11-15 ENCOUNTER — Other Ambulatory Visit: Payer: PRIVATE HEALTH INSURANCE

## 2018-11-15 DIAGNOSIS — Z20822 Contact with and (suspected) exposure to covid-19: Secondary | ICD-10-CM

## 2018-11-17 ENCOUNTER — Encounter: Payer: Self-pay | Admitting: Family Medicine

## 2018-11-17 LAB — NOVEL CORONAVIRUS, NAA: SARS-CoV-2, NAA: DETECTED — AB

## 2018-11-18 NOTE — Telephone Encounter (Signed)
Called pt and he states he has not had any symptoms. He got tested because he was around someone who was positive. Doing well. Discussed warning signs. Health department notified.

## 2018-11-24 NOTE — Telephone Encounter (Signed)
Patient doing well He is finished up with his quarantine He will still maintain precautions to prevent future infection and will follow-up with [place] if any problems

## 2018-12-13 ENCOUNTER — Other Ambulatory Visit: Payer: Self-pay | Admitting: Family Medicine

## 2018-12-14 NOTE — Telephone Encounter (Signed)
May have 30-day with 1 refill needs follow-up office visit in the fall, may be virtual if the patient prefers

## 2018-12-24 ENCOUNTER — Ambulatory Visit: Payer: PRIVATE HEALTH INSURANCE | Admitting: Family Medicine

## 2018-12-26 ENCOUNTER — Encounter: Payer: Self-pay | Admitting: Family Medicine

## 2019-01-11 ENCOUNTER — Other Ambulatory Visit: Payer: Self-pay | Admitting: Family Medicine

## 2019-01-12 ENCOUNTER — Other Ambulatory Visit: Payer: Self-pay | Admitting: Family Medicine

## 2019-01-12 NOTE — Telephone Encounter (Signed)
May have this in 2 refills needs follow-up this fall in person or virtual

## 2019-01-15 ENCOUNTER — Other Ambulatory Visit: Payer: Self-pay | Admitting: Family Medicine

## 2019-01-17 ENCOUNTER — Other Ambulatory Visit: Payer: Self-pay | Admitting: Family Medicine

## 2019-01-17 NOTE — Telephone Encounter (Signed)
Schedule follow-up within 30 days may have 1 refill each May be virtual or in person patient's joints

## 2019-02-10 ENCOUNTER — Other Ambulatory Visit: Payer: Self-pay | Admitting: Family Medicine

## 2019-02-13 ENCOUNTER — Other Ambulatory Visit: Payer: Self-pay | Admitting: Family Medicine

## 2019-02-14 ENCOUNTER — Other Ambulatory Visit: Payer: Self-pay | Admitting: Family Medicine

## 2019-02-14 NOTE — Telephone Encounter (Signed)
Patient can have 90 days worth of refills Does need to do office visit or virtual visit by the end of the year

## 2019-02-14 NOTE — Telephone Encounter (Signed)
3 months on current medicine Does need to do follow-up office visit virtual or in person by the end of the year

## 2019-02-15 ENCOUNTER — Other Ambulatory Visit: Payer: Self-pay | Admitting: Family Medicine

## 2019-02-16 ENCOUNTER — Other Ambulatory Visit: Payer: Self-pay | Admitting: Family Medicine

## 2019-02-17 ENCOUNTER — Other Ambulatory Visit: Payer: Self-pay | Admitting: Family Medicine

## 2019-02-18 ENCOUNTER — Other Ambulatory Visit: Payer: Self-pay | Admitting: Family Medicine

## 2019-03-17 ENCOUNTER — Other Ambulatory Visit: Payer: Self-pay | Admitting: Family Medicine

## 2019-03-18 ENCOUNTER — Other Ambulatory Visit: Payer: Self-pay | Admitting: Family Medicine

## 2019-03-18 NOTE — Telephone Encounter (Signed)
Left message to return call- needs virtual office visit

## 2019-03-18 NOTE — Telephone Encounter (Signed)
Left message return call- needs virtual office visit

## 2019-03-21 NOTE — Telephone Encounter (Signed)
Phone visit scheduled for 03/29/2019  Please refill

## 2019-03-29 ENCOUNTER — Telehealth: Payer: Self-pay | Admitting: Family Medicine

## 2019-03-29 ENCOUNTER — Other Ambulatory Visit: Payer: Self-pay

## 2019-03-29 ENCOUNTER — Ambulatory Visit (INDEPENDENT_AMBULATORY_CARE_PROVIDER_SITE_OTHER): Payer: PRIVATE HEALTH INSURANCE | Admitting: Family Medicine

## 2019-03-29 DIAGNOSIS — N289 Disorder of kidney and ureter, unspecified: Secondary | ICD-10-CM

## 2019-03-29 DIAGNOSIS — E782 Mixed hyperlipidemia: Secondary | ICD-10-CM | POA: Diagnosis not present

## 2019-03-29 DIAGNOSIS — I10 Essential (primary) hypertension: Secondary | ICD-10-CM

## 2019-03-29 NOTE — Progress Notes (Signed)
Subjective:    Patient ID: [name] [name], male    DOB: 06-17-1955, 63 y.o.   MRN: [id number]  Hyperlipidemia This is a chronic problem. Pertinent negatives include no chest pain or shortness of breath. There are no compliance problems (takes meds every day, eats healthy, exercises).    Bilateral shoulder pain for about 1-2 years. Taking tylenol but not helping with pain. He states he has bilateral shoulder pain is able to lift weights. Has burning discomfort into his shoulders. Orthopedist told him he is partially torn rotator cuff and some arthritis.  Patient states blood pressure been doing okay lately he did see kidney specialist they did run some additional tests and they told him his kidney functions look stable  Lower left leg sometimes gets cold. Started a few days ago. Happens about every other day. Has tried a heating pad.  He isn't sure if it is actually temperature cold or just feels cold no swelling. Virtual Visit via Video Note  I connected with [name] [name] on 03/29/19 at  9:00 AM EST by a video enabled telemedicine application and verified that I am speaking with the correct person using two identifiers.  Location: Patient: home Provider: office   I discussed the limitations of evaluation and management by telemedicine and the availability of in person appointments. The patient expressed understanding and agreed to proceed.  History of Present Illness:    Observations/Objective:   Assessment and Plan:   Follow Up Instructions:    I discussed the assessment and treatment plan with the patient. The patient was provided an opportunity to ask questions and all were answered. The patient agreed with the plan and demonstrated an understanding of the instructions.   The patient was advised to call back16 or seek an in-person evaluation if the symptoms worsen or if the condition fails to improve as anticipated.  I provided 16 minutes of non-face-to-face time  during this encounter.      Review of Systems  Constitutional: Negative for diaphoresis and fatigue.  HENT: Negative for congestion and rhinorrhea.   Respiratory: Negative for cough and shortness of breath.   Cardiovascular: Negative for chest pain and leg swelling.  Gastrointestinal: Negative for abdominal pain and diarrhea.  Skin: Negative for color change and rash.  Neurological: Negative for dizziness and headaches.  Psychiatric/Behavioral: Negative for behavioral problems and confusion.       Objective:   Physical Exam Vitals signs reviewed.  Constitutional:      General: He is not in acute distress. HENT:     Head: Normocephalic and atraumatic.  Eyes:     General:        Right eye: No discharge.        Left eye: No discharge.  Neck:     Trachea: No tracheal deviation.  Cardiovascular:     Rate and Rhythm: Normal rate and regular rhythm.     Heart sounds: Normal heart sounds. No murmur.  Pulmonary:     Effort: Pulmonary effort is normal. No respiratory distress.     Breath sounds: Normal breath sounds.  Lymphadenopathy:     Cervical: No cervical adenopathy.  Skin:    General: Skin is warm and dry.  Neurological:     Mental Status: He is alert.     Coordination: Coordination normal.  Psychiatric:        Behavior: Behavior normal.           Assessment & Plan:  HTN good control continue current  measures  Renal insufficiency sees kidney specialist on a regular basis we will get the lab notes from them to review  Possible circulation issue in the left lower leg recommend office visit so we can check pulses may need further testing

## 2019-03-29 NOTE — Telephone Encounter (Signed)
Front  Patient states that he saw [facility] earlier this week and they drew blood on him.  He states they were sending the office note and lab work to me via fax for this visit.  I did not see it.  If we have not received this office note and lab work please call [facility] requested a fax the office note and lab work today if possible so I can review it thank you

## 2019-04-06 ENCOUNTER — Ambulatory Visit: Payer: PRIVATE HEALTH INSURANCE | Admitting: Family Medicine

## 2019-04-07 ENCOUNTER — Other Ambulatory Visit: Payer: Self-pay

## 2019-04-07 ENCOUNTER — Encounter: Payer: Self-pay | Admitting: Family Medicine

## 2019-04-07 ENCOUNTER — Ambulatory Visit (INDEPENDENT_AMBULATORY_CARE_PROVIDER_SITE_OTHER): Payer: PRIVATE HEALTH INSURANCE | Admitting: Family Medicine

## 2019-04-07 VITALS — BP 126/80 | Temp 98.4°F | Ht 75.0 in | Wt 278.0 lb

## 2019-04-07 DIAGNOSIS — G609 Hereditary and idiopathic neuropathy, unspecified: Secondary | ICD-10-CM

## 2019-04-07 DIAGNOSIS — I10 Essential (primary) hypertension: Secondary | ICD-10-CM

## 2019-04-07 DIAGNOSIS — E782 Mixed hyperlipidemia: Secondary | ICD-10-CM

## 2019-04-07 NOTE — Progress Notes (Signed)
   Subjective:    Patient ID: [name] [name], male    DOB: Dec 06, 1955, 63 y.o.   MRN: [id number]  HPIfollow up on  left lower part of leg feeling cold off and on.  Patient relates that he is having the left lower leg feel colder than the other side he is concerned that this could be circulation he states his sensation comes and goes will last anywhere from part of the day to all day long if he puts a heating blanket or warm compress it feels better he denies numbness or tingling denies foot pain denies ulcers on the scan states he is keeping blood pressure under good control. Patient recently had lab work through nephrology these were reviewed his creatinine is 1.76 phosphorus slightly low History hypertension Review of Systems  Constitutional: Negative for diaphoresis and fatigue.  HENT: Negative for congestion and rhinorrhea.   Respiratory: Negative for cough and shortness of breath.   Cardiovascular: Negative for chest pain and leg swelling.  Gastrointestinal: Negative for abdominal pain and diarrhea.  Skin: Negative for color change and rash.  Neurological: Negative for dizziness and headaches.  Psychiatric/Behavioral: Negative for behavioral problems and confusion.       Objective:   Physical Exam Vitals reviewed.  Cardiovascular:     Rate and Rhythm: Normal rate and regular rhythm.     Heart sounds: Normal heart sounds. No murmur.  Pulmonary:     Effort: Pulmonary effort is normal.     Breath sounds: Normal breath sounds.  Lymphadenopathy:     Cervical: No cervical adenopathy.  Neurological:     Mental Status: He is alert.  Psychiatric:        Behavior: Behavior normal.   Blood pressure was checked 126/80 Both lower legs no swelling pulses are good in both feet no obvious issues skin is warm on both sides no ulcers        Assessment & Plan:  I believe that this is more of a nerve related issue.  Does not appear to be a circulation issue I think it is reasonable to  do some lab work to look at B12 etc.  No need for nerve conduction studies currently  Renal insufficiency patient needs to minimize the amount of protein he is taking it on a regular basis

## 2019-04-16 ENCOUNTER — Other Ambulatory Visit: Payer: Self-pay | Admitting: Family Medicine

## 2019-04-18 ENCOUNTER — Other Ambulatory Visit: Payer: Self-pay | Admitting: Family Medicine

## 2019-04-27 LAB — LIPID PANEL
Chol/HDL Ratio: 2.9 ratio (ref 0.0–5.0)
Cholesterol, Total: 175 mg/dL (ref 100–199)
HDL: 61 mg/dL (ref >39)
LDL Chol Calc (NIH): 102 mg/dL — ABNORMAL HIGH (ref 0–99)
Triglycerides: 62 mg/dL (ref 0–149)
VLDL Cholesterol Cal: 12 mg/dL (ref 5–40)

## 2019-04-27 LAB — HEPATIC FUNCTION PANEL
ALT: 23 IU/L (ref 0–44)
AST: 24 IU/L (ref 0–40)
Albumin: 4.2 g/dL (ref 3.8–4.8)
Alkaline Phosphatase: 112 IU/L (ref 39–117)
Bilirubin Total: 0.5 mg/dL (ref 0.0–1.2)
Bilirubin, Direct: 0.13 mg/dL (ref 0.00–0.40)
Total Protein: 6.7 g/dL (ref 6.0–8.5)

## 2019-04-27 LAB — TSH: TSH: 2.06 u[IU]/mL (ref 0.450–4.500)

## 2019-04-27 LAB — VITAMIN B12: Vitamin B-12: 994 pg/mL (ref 232–1245)

## 2019-05-03 ENCOUNTER — Encounter: Payer: Self-pay | Admitting: Family Medicine

## 2019-05-09 ENCOUNTER — Encounter: Payer: Self-pay | Admitting: Family Medicine

## 2019-05-13 DIAGNOSIS — M67911 Unspecified disorder of synovium and tendon, right shoulder: Secondary | ICD-10-CM

## 2019-05-13 DIAGNOSIS — M67912 Unspecified disorder of synovium and tendon, left shoulder: Secondary | ICD-10-CM

## 2019-05-20 ENCOUNTER — Other Ambulatory Visit: Payer: Self-pay | Admitting: *Deleted

## 2019-05-20 MED ORDER — METOPROLOL SUCCINATE ER 50 MG PO TB24: ORAL_TABLET | ORAL | 4 refills | Status: DC

## 2019-05-20 MED ORDER — NIFEDIPINE ER OSMOTIC RELEASE 90 MG PO TB24: ORAL_TABLET | ORAL | 4 refills | Status: DC

## 2019-05-20 MED ORDER — LOSARTAN POTASSIUM 100 MG PO TABS: 100.0000 mg | ORAL_TABLET | Freq: Every day | ORAL | 4 refills | Status: DC

## 2019-05-20 MED ORDER — POTASSIUM CHLORIDE CRYS ER 20 MEQ PO TBCR: EXTENDED_RELEASE_TABLET | ORAL | 4 refills | Status: DC

## 2019-05-20 MED ORDER — DOXAZOSIN MESYLATE 4 MG PO TABS: ORAL_TABLET | ORAL | 4 refills | Status: DC

## 2019-07-24 ENCOUNTER — Encounter: Payer: Self-pay | Admitting: Family Medicine

## 2019-07-25 NOTE — Telephone Encounter (Signed)
Please contact pt to schedule appointment by June and send back to nurses to refill med.

## 2019-07-25 NOTE — Telephone Encounter (Signed)
Last med check up 03/29/19

## 2019-07-25 NOTE — Telephone Encounter (Signed)
Requesting indapamide

## 2019-07-25 NOTE — Telephone Encounter (Signed)
See other message

## 2019-07-25 NOTE — Telephone Encounter (Signed)
May have 90-day needs follow-up by June 

## 2019-07-27 ENCOUNTER — Other Ambulatory Visit: Payer: Self-pay | Admitting: Family Medicine

## 2019-07-27 ENCOUNTER — Other Ambulatory Visit: Payer: Self-pay | Admitting: *Deleted

## 2019-07-27 MED ORDER — INDAPAMIDE 1.25 MG PO TABS: ORAL_TABLET | ORAL | 0 refills | Status: DC

## 2019-08-08 ENCOUNTER — Encounter: Payer: Self-pay | Admitting: *Deleted

## 2019-08-14 ENCOUNTER — Other Ambulatory Visit: Payer: Self-pay | Admitting: Family Medicine

## 2019-09-06 ENCOUNTER — Encounter: Payer: Self-pay | Admitting: Urology

## 2019-09-06 ENCOUNTER — Ambulatory Visit (INDEPENDENT_AMBULATORY_CARE_PROVIDER_SITE_OTHER): Payer: PRIVATE HEALTH INSURANCE | Admitting: Urology

## 2019-09-06 ENCOUNTER — Other Ambulatory Visit: Payer: Self-pay

## 2019-09-06 ENCOUNTER — Telehealth: Payer: Self-pay | Admitting: Family Medicine

## 2019-09-06 VITALS — Temp 97.5°F | Ht 75.0 in | Wt 265.0 lb

## 2019-09-06 DIAGNOSIS — R32 Unspecified urinary incontinence: Secondary | ICD-10-CM

## 2019-09-06 DIAGNOSIS — C61 Malignant neoplasm of prostate: Secondary | ICD-10-CM

## 2019-09-06 DIAGNOSIS — N5201 Erectile dysfunction due to arterial insufficiency: Secondary | ICD-10-CM

## 2019-09-06 LAB — POCT URINALYSIS DIPSTICK
Bilirubin, UA: NEGATIVE
Blood, UA: NEGATIVE
Glucose, UA: NEGATIVE
Ketones, UA: NEGATIVE
Leukocytes, UA: NEGATIVE
Nitrite, UA: NEGATIVE
Protein, UA: NEGATIVE
Spec Grav, UA: 1.02 (ref 1.010–1.025)
Urobilinogen, UA: 0.2 E.U./dL
pH, UA: 5 (ref 5.0–8.0)

## 2019-09-06 MED ORDER — TADALAFIL 20 MG PO TABS: 20.0000 mg | ORAL_TABLET | ORAL | 11 refills | Status: DC | PRN

## 2019-09-06 NOTE — Telephone Encounter (Signed)
Patient dropped off Labs from the [place] for you to review in your box.

## 2019-09-06 NOTE — Telephone Encounter (Signed)
Please advise. Thank you

## 2019-09-06 NOTE — Progress Notes (Signed)
See H& P.

## 2019-09-06 NOTE — Progress Notes (Signed)
H&P  Chief Complaint: Prostate Cancer  History of Present Illness:   5.18.2021: Last seen in 2018, he returns today for follow-up. Most recent PSA was undetectable (this was drawn on 3.4.2020). He was taught how to self-administer testosterone injections to initiate repletion but he has opted to forgo this for now as he finds the injections unpleasant. He has been managing his ED with vacuum pump system w/ constriction bands -- he is still not very interested in injection therapy for his ED for the same reason he is uninterested in continuing testosterone repletion. He denies any urinary complaints aside from occasional leakage (only one pad per day, mostly worn for peace of mind).   Sexual Health Inventory Form 1. How do you rate your confidence that you could get and keep an erection? 1 - Very low  2. When you had erections with stimulation, how often were your erections hard enough for penetration? 1 - Almost never or never  3. During sexual intercourse, how often were you able to maintain your erection after you had penetrated your partner?  1 - Almost never or never  4. During sexual intercourse, how difficult was it to maintain your erection to completion of intercourse?  1 - Extremely difficult  5. When you attempted sexual intercourse, how often was it satisfactory for you? 4 - Most times (much more than half)   Total SHIM Score  8    (below copied from [facility] records):  Prostate Cancer:   [name] [name] is a 64 year-old male established patient who is here for prostate cancer which has been treated.  He is not participating in active surveillance. He did have surgery. He had the following treatment for prostate cancer: retropubic prostatectomy. He did not receive radiation therapy for his cancer.   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 He has had undetectable PSA's with no evidence of  disease.  ----------------------------------------------------------------------------------------------------------------------------------------  His prostate cancer was diagnosed 02/23/2003. His cancer was diagnosed by Dr Serita Butcher. His PSA at his time of diagnosis was 6.65. His most recent PSA is 0.   He has undergone surgery for treatment. He has not undergone External Beam Radiation Therapy for treatment. He has not undergone Hormonal Therapy for treatment.   He does have urinary incontinence. He does have problems with erectile dysfunction. He has not recently had unwanted weight loss. He is not having pain in new locations.   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 He has had undetectable PSA's with no evidence of disease. He has been getting f/u with his PCP.  Over the past year, he has had no significant urinary complaints. No blood in his urine. Stable, minimal stress urinary incontinence.   ED:  His symptoms have been worse over the last year.   He does have difficulties achieving an erection. He does have problems maintaining his erections. His erections are straight. He has tried Viagra.   he uses a vacuum device, but even with this and sildenafil, he gets inadequate erections. He does not want to use injectables. His libido is somewhat low. He questions whether he has a low testosterone  Past Medical History:  Diagnosis Date  . Adenomatous colon polyp 2007 [place]  . DDD (degenerative disc disease)   . GERD (gastroesophageal reflux disease)   . Herniated disc    Back  . HTN (hypertension)   .  Hyperlipemia   . Prostate CA [facility]) 2004    Past Surgical History:  Procedure Laterality Date  . COLONOSCOPY  11/15/2010   Dr. Fields:Pedunculated polyp/pan-colonic  TICS/mod IH. tubular adenoma  . COLONOSCOPY  2007   Dr. Oneida Alar: 3 mm tubular adeoma  . COLONOSCOPY N/A 03/08/2014   Procedure: COLONOSCOPY;   Surgeon: Danie Binder, MD;  [facility]: [facility];  Service: Endoscopy;  Laterality: N/A;  100 - moved to 12:45 - Ginger to notify pt  . COLONOSCOPY N/A 10/17/2016   Procedure: COLONOSCOPY;  Surgeon: Danie Binder, MD;  Location: [facility];  Service: Endoscopy;  Laterality: N/A;  9:00am  . KNEE ARTHROSCOPY W/ MENISCECTOMY  2011 RIGHT  . LAMINECTOMY AND MICRODISCECTOMY LUMBAR SPINE  2002  . PROSTATECTOMY      Home Medications:  Allergies as of 09/06/2019   No Known Allergies     Medication List       Accurate as of Sep 06, 2019  3:25 PM. If you have any questions, ask your nurse or doctor.        b complex vitamins capsule Take 1 capsule by mouth daily.   cetirizine 10 MG tablet Commonly known as: ZYRTEC TAKE ONE (1) TABLET BY MOUTH EVERY DAY   doxazosin 4 MG tablet Commonly known as: CARDURA TAKE 1 TABLET(4 MG) BY MOUTH AT BEDTIME   fluorometholone 0.1 % ophthalmic suspension Commonly known as: FML 1 drop 3 (three) times daily.   indapamide 1.25 MG tablet Commonly known as: LOZOL TAKE 1 TABLET(1.25 MG) BY MOUTH DAILY   losartan 100 MG tablet Commonly known as: COZAAR Take 1 tablet (100 mg total) by mouth daily.   metoprolol succinate 50 MG 24 hr tablet Commonly known as: TOPROL-XL TAKE 1 TABLET BY MOUTH DAILY IMMEDIATELY FOLLOWING MEALS   multivitamin capsule Take 1 capsule by mouth daily.   NIFEdipine 90 MG 24 hr tablet Commonly known as: PROCARDIA XL/NIFEDICAL-XL TAKE 1 TABLET(90 MG) BY MOUTH DAILY   Omeprazole 20 MG Tbec TAKE 1 TABLET BY MOUTH EVERY DAY   potassium chloride SA 20 MEQ tablet Commonly known as: KLOR-CON Take 3 tablets by mouth every evening and every morning   rosuvastatin 20 MG tablet Commonly known as: CRESTOR TAKE 1 TABLET BY MOUTH EVERY DAY       Allergies: No Known Allergies  Family History  Problem Relation Age of Onset  . Heart Problems Mother   . Aneurysm Brother   . Colon cancer Neg Hx   . Colon polyps Neg  Hx   . Neuropathy Neg Hx     Social History:  reports that he has never smoked. He has never used smokeless tobacco. He reports that he does not drink alcohol or use drugs.  ROS: Urological Symptom Review Patient is experiencing the following symptoms: Leakage of urine Review of Systems Gastrointestinal (upper)  : Negative for upper GI symptoms Gastrointestinal (lower) : Negative for lower GI symptoms Constitutional : Negative for symptoms Skin: Negative for skin symptoms Eyes: Negative for eye symptoms Ear/Nose/Throat : Negative for Ear/Nose/Throat symptoms Hematologic/Lymphatic: Negative for Hematologic/Lymphatic symptoms Cardiovascular : Negative for cardiovascular symptoms Respiratory : Negative for respiratory symptoms Endocrine: Negative for endocrine symptoms Musculoskeletal: Negative for musculoskeletal symptoms Neurological: Negative for neurological symptoms Psychologic: Negative for psychiatric symptoms  Physical Exam:  Vital signs in last 24 hours: Temp (!) 97.5 F (36.4 C)   Ht 6\' 3"  (1.905 m)   Wt 265 lb (120.2 kg)   BMI 33.12 kg/m  Constitutional:  Alert and oriented, No acute distress Cardiovascular: Regular rate  Respiratory: Normal respiratory effort GI: Abdomen is soft, nontender, nondistended, no abdominal masses. No CVAT.  Genitourinary: Not completed. Lymphatic: No lymphadenopathy Neurologic: Grossly intact, no focal deficits Psychiatric: Normal mood and affect   Results for orders placed or performed in visit on 09/06/19 (from the past 24 hour(s))  POCT urinalysis dipstick     Status: None   Collection Time: 09/06/19  2:54 PM  Result Value Ref Range   Color, UA yellow    Clarity, UA     Glucose, UA Negative Negative   Bilirubin, UA neg    Ketones, UA neg    Spec Grav, UA 1.020 1.010 - 1.025   Blood, UA neg    pH, UA 5.0 5.0 - 8.0   Protein, UA Negative Negative   Urobilinogen, UA 0.2 0.2 or 1.0 E.U./dL   Nitrite, UA neg     Leukocytes, UA Negative Negative   Appearance clear    Odor      I have reviewed prior pt notes  I have reviewed notes from referring/previous physicians  I have reviewed urinalysis results  I have reviewed prior PSA/T results   Impression/Assessment:  Given his PSA trend this far out from treatment I am not too concerned about recurrent disease.   He is not currently pursuing testosterone repletion due to not really tolerating the injections nor administration schedule very well. When last checked in 3.2020, his testosterone was >400 so no need to pursue this at present.   His ED is managed well enough with vacuum pump system with constriction bands -- he may want to order new bands if his current ones are wearing out. He is still against the idea of injection therapy for ED. He may also want to try oral medications again now that these are generic/much less cost prohibitive.   Plan:  1. Advised to order new constriction bands   2. He would like to resume cialis to try in conjunction with vacuum device  3. Return PRN  4. He is fine to have PSA checked per PCP.

## 2019-09-09 NOTE — Telephone Encounter (Signed)
Please let the patient know I did review his labs from the [place] [facility] I am pleased with the results-we will see him at his follow-up visit in early June.  (PSA less than 0.13, A1c 6.1, hemoglobin 13.6, hematocrit 41.8, platelets 217, total protein 6.9, albumin 3.9, SGOT 29, ALT 39, lipid-HDL 62, LDL 90, total 163, triglyceride 57, glucose 92, potassium 3.6, calcium 9.0, GFR 51, creatinine 1.66, TSH is 0.764) Lab documentation will be scanned into the system

## 2019-09-09 NOTE — Telephone Encounter (Signed)
Attempted to contacted patient. Someone picked up then hung up. Will try again

## 2019-09-09 NOTE — Telephone Encounter (Signed)
Pt returned call and verbalized understanding  

## 2019-09-13 ENCOUNTER — Encounter: Payer: Self-pay | Admitting: Gastroenterology

## 2019-09-27 ENCOUNTER — Encounter: Payer: Self-pay | Admitting: Family Medicine

## 2019-09-27 ENCOUNTER — Other Ambulatory Visit: Payer: Self-pay

## 2019-09-27 ENCOUNTER — Ambulatory Visit (INDEPENDENT_AMBULATORY_CARE_PROVIDER_SITE_OTHER): Payer: PRIVATE HEALTH INSURANCE | Admitting: Family Medicine

## 2019-09-27 VITALS — BP 128/86 | Temp 98.4°F | Wt 266.0 lb

## 2019-09-27 DIAGNOSIS — I10 Essential (primary) hypertension: Secondary | ICD-10-CM

## 2019-09-27 DIAGNOSIS — E782 Mixed hyperlipidemia: Secondary | ICD-10-CM

## 2019-09-27 DIAGNOSIS — R7303 Prediabetes: Secondary | ICD-10-CM | POA: Diagnosis not present

## 2019-09-27 DIAGNOSIS — G6289 Other specified polyneuropathies: Secondary | ICD-10-CM

## 2019-09-27 DIAGNOSIS — N289 Disorder of kidney and ureter, unspecified: Secondary | ICD-10-CM | POA: Diagnosis not present

## 2019-09-27 DIAGNOSIS — G629 Polyneuropathy, unspecified: Secondary | ICD-10-CM | POA: Insufficient documentation

## 2019-09-27 MED ORDER — NIFEDIPINE ER OSMOTIC RELEASE 90 MG PO TB24: ORAL_TABLET | ORAL | 1 refills | Status: DC

## 2019-09-27 MED ORDER — INDAPAMIDE 1.25 MG PO TABS: ORAL_TABLET | ORAL | 1 refills | Status: DC

## 2019-09-27 MED ORDER — ROSUVASTATIN CALCIUM 20 MG PO TABS: 20.0000 mg | ORAL_TABLET | Freq: Every day | ORAL | 1 refills | Status: DC

## 2019-09-27 MED ORDER — POTASSIUM CHLORIDE CRYS ER 20 MEQ PO TBCR: EXTENDED_RELEASE_TABLET | ORAL | 1 refills | Status: DC

## 2019-09-27 MED ORDER — LOSARTAN POTASSIUM 100 MG PO TABS: 100.0000 mg | ORAL_TABLET | Freq: Every day | ORAL | 1 refills | Status: DC

## 2019-09-27 MED ORDER — METOPROLOL SUCCINATE ER 50 MG PO TB24: ORAL_TABLET | ORAL | 1 refills | Status: DC

## 2019-09-27 MED ORDER — DOXAZOSIN MESYLATE 4 MG PO TABS: ORAL_TABLET | ORAL | 1 refills | Status: DC

## 2019-09-27 NOTE — Progress Notes (Signed)
   Subjective:    Patient ID: [name] [name], male    DOB: [birth year], 64 y.o.   MRN: [id number]  Hypertension This is a chronic problem. Pertinent negatives include no chest pain, headaches or shortness of breath. Treatments tried: losartan, inadapamide, metoprolol.  Hyperlipidemia This is a chronic problem. Pertinent negatives include no chest pain or shortness of breath. Treatments tried: rosuvastatin, diet is pretty good and he is getting regular exercise.    No concerns.   Review of Systems  Constitutional: Negative for diaphoresis and fatigue.  HENT: Negative for congestion and rhinorrhea.   Respiratory: Negative for cough and shortness of breath.   Cardiovascular: Negative for chest pain and leg swelling.  Gastrointestinal: Negative for abdominal pain and diarrhea.  Skin: Negative for color change and rash.  Neurological: Negative for dizziness and headaches.  Psychiatric/Behavioral: Negative for behavioral problems and confusion.       Objective:   Physical Exam Vitals reviewed.  Constitutional:      General: He is not in acute distress. HENT:     Head: Normocephalic and atraumatic.  Eyes:     General:        Right eye: No discharge.        Left eye: No discharge.  Neck:     Trachea: No tracheal deviation.  Cardiovascular:     Rate and Rhythm: Normal rate and regular rhythm.     Heart sounds: Normal heart sounds. No murmur.  Pulmonary:     Effort: Pulmonary effort is normal. No respiratory distress.     Breath sounds: Normal breath sounds.  Lymphadenopathy:     Cervical: No cervical adenopathy.  Skin:    General: Skin is warm and dry.  Neurological:     Mental Status: He is alert.     Coordination: Coordination normal.  Psychiatric:        Behavior: Behavior normal.           Assessment & Plan:  1. Renal insufficiency Patient has chronic kidney disease.  Stable currently recent lab work through the [place] look good keep blood pressure under good  control avoid excessive protein intake  2. Essential hypertension, benign Blood pressure good control today continue current medications.  No orthostasis  3. Diabetic polyneuropathy associated with type 2 diabetes mellitus ([place]) Patient does not have diabetes he does have prediabetes and also has keeping things under control by watching diet and staying active A1c was 6.1 through the [place]  4. Mixed hyperlipidemia Cholesterol under good control continue current measures continue take medication  5. Prediabetes Please see above.  6. Other polyneuropathy The neuropathy in his feet probably related to prediabetes but this is technically not diabetic polyneuropathy.  Follow-up 6 months  Patient is interested in gastroenterologist taking Dr. Oneida [name] place we will try to find out more information for him regarding colonoscopy

## 2019-10-26 ENCOUNTER — Other Ambulatory Visit: Payer: Self-pay

## 2019-10-26 ENCOUNTER — Encounter: Payer: Self-pay | Admitting: Family Medicine

## 2019-10-26 ENCOUNTER — Ambulatory Visit (INDEPENDENT_AMBULATORY_CARE_PROVIDER_SITE_OTHER): Payer: PRIVATE HEALTH INSURANCE | Admitting: Orthopedic Surgery

## 2019-10-26 ENCOUNTER — Encounter: Payer: Self-pay | Admitting: Orthopedic Surgery

## 2019-10-26 VITALS — BP 143/81 | HR 58 | Ht 75.0 in | Wt 266.0 lb

## 2019-10-26 DIAGNOSIS — M67911 Unspecified disorder of synovium and tendon, right shoulder: Secondary | ICD-10-CM

## 2019-10-26 MED ORDER — MELOXICAM 7.5 MG PO TABS
7.5000 mg | ORAL_TABLET | Freq: Every day | ORAL | 5 refills | Status: DC
Start: 2019-10-26 — End: 2020-02-10

## 2019-10-26 NOTE — Progress Notes (Signed)
Chief Complaint  Patient presents with  . 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  He had an MRI back in 2019 which showed he had a partial width tear anterior supraspinatus tendon with focal areas of full-thickness tear and 1.5 cm retraction infraspinatus tendinosis full-thickness tear with retraction to the mid humeral head  He has maintain good function and range of motion his right shoulder he still able to bench press 250 pounds he is limited his activities to less than overhead activity  Whose exam shows a difficult drainage strength in his right and left shoulder with grade 4 weakness in abduction and flexion on the right normal on the left  His range of motion shows 150 degrees of active forward elevation  We will repeat his injection we talked about surgical options which include total shoulder versus reverse with options for repair which would be made at the time of surgery  At this time he wishes to maintain his nonoperative treatment   Procedure note the subacromial injection shoulder RIGHT    Verbal consent was obtained to inject the  RIGHT   Shoulder  Timeout was completed to confirm the injection site is a subacromial space of the  RIGHT  shoulder   Medication used Depo-Medrol 40 mg and lidocaine 1% 3 cc  Anesthesia was provided by ethyl chloride  The injection was performed in the RIGHT  posterior subacromial space. After pinning the skin with alcohol and anesthetized the skin with ethyl chloride the subacromial space was injected using a 20-gauge needle. There were no complications  Sterile dressing was applied.  We are also going to put him on some arthritis medicine which he inquired about.  Meds ordered this encounter  Medications  . meloxicam (MOBIC) 7.5 MG tablet    Sig: Take 1 tablet (7.5 mg  total) by mouth daily.    Dispense:  30 tablet    Refill:  5    Encounter Diagnosis  Name Primary?  . Rotator cuff dysfunction, right Yes    Follow-up 1 year

## 2020-01-18 ENCOUNTER — Other Ambulatory Visit: Payer: Self-pay | Admitting: Family Medicine

## 2020-01-19 MED ORDER — METOPROLOL SUCCINATE ER 50 MG PO TB24: ORAL_TABLET | ORAL | 0 refills | Status: DC

## 2020-01-19 NOTE — Addendum Note (Signed)
Addended by: Dairl Ponder on: 01/19/2020 11:07 AM   Modules accepted: Orders

## 2020-01-22 ENCOUNTER — Other Ambulatory Visit: Payer: Self-pay | Admitting: Family Medicine

## 2020-01-26 ENCOUNTER — Telehealth: Payer: Self-pay

## 2020-01-26 DIAGNOSIS — N289 Disorder of kidney and ureter, unspecified: Secondary | ICD-10-CM

## 2020-01-26 DIAGNOSIS — E782 Mixed hyperlipidemia: Secondary | ICD-10-CM

## 2020-01-26 DIAGNOSIS — R7303 Prediabetes: Secondary | ICD-10-CM

## 2020-01-26 DIAGNOSIS — I10 Essential (primary) hypertension: Secondary | ICD-10-CM

## 2020-01-26 NOTE — Telephone Encounter (Signed)
Last labs completed 04/26/19 hepatic, lipid, tsh and b12. Please advise. Thank you

## 2020-01-26 NOTE — Telephone Encounter (Signed)
I recommend metabolic 7, liver, lipid and A1c Hypertension renal insufficiency prediabetes

## 2020-01-26 NOTE — Telephone Encounter (Signed)
Patient is requesting labs for his 10/22 visit

## 2020-01-27 NOTE — Telephone Encounter (Signed)
Lab orders placed and my chart message sent

## 2020-02-03 LAB — HEPATIC FUNCTION PANEL
ALT: 18 IU/L (ref 0–44)
AST: 15 IU/L (ref 0–40)
Albumin: 4.1 g/dL (ref 3.8–4.8)
Alkaline Phosphatase: 125 IU/L — ABNORMAL HIGH (ref 44–121)
Bilirubin Total: 0.7 mg/dL (ref 0.0–1.2)
Bilirubin, Direct: 0.18 mg/dL (ref 0.00–0.40)
Total Protein: 6.4 g/dL (ref 6.0–8.5)

## 2020-02-03 LAB — BASIC METABOLIC PANEL
BUN/Creatinine Ratio: 11 (ref 10–24)
BUN: 16 mg/dL (ref 8–27)
CO2: 27 mmol/L (ref 20–29)
Calcium: 8.9 mg/dL (ref 8.6–10.2)
Chloride: 103 mmol/L (ref 96–106)
Creatinine, Ser: 1.46 mg/dL — ABNORMAL HIGH (ref 0.76–1.27)
GFR calc Af Amer: 58 mL/min/{1.73_m2} — ABNORMAL LOW (ref >59)
GFR calc non Af Amer: 50 mL/min/{1.73_m2} — ABNORMAL LOW (ref >59)
Glucose: 90 mg/dL (ref 65–99)
Potassium: 3.7 mmol/L (ref 3.5–5.2)
Sodium: 146 mmol/L — ABNORMAL HIGH (ref 134–144)

## 2020-02-03 LAB — LIPID PANEL
Chol/HDL Ratio: 2.9 ratio (ref 0.0–5.0)
Cholesterol, Total: 163 mg/dL (ref 100–199)
HDL: 57 mg/dL (ref >39)
LDL Chol Calc (NIH): 95 mg/dL (ref 0–99)
Triglycerides: 52 mg/dL (ref 0–149)
VLDL Cholesterol Cal: 11 mg/dL (ref 5–40)

## 2020-02-03 LAB — HEMOGLOBIN A1C
Est. average glucose Bld gHb Est-mCnc: 123 mg/dL
Hgb A1c MFr Bld: 5.9 % — ABNORMAL HIGH (ref 4.8–5.6)

## 2020-02-10 ENCOUNTER — Ambulatory Visit (INDEPENDENT_AMBULATORY_CARE_PROVIDER_SITE_OTHER): Payer: PRIVATE HEALTH INSURANCE | Admitting: Family Medicine

## 2020-02-10 ENCOUNTER — Other Ambulatory Visit: Payer: Self-pay

## 2020-02-10 ENCOUNTER — Encounter: Payer: Self-pay | Admitting: Family Medicine

## 2020-02-10 VITALS — BP 124/76 | HR 70 | Temp 97.9°F | Ht 73.5 in | Wt 252.0 lb

## 2020-02-10 DIAGNOSIS — Z23 Encounter for immunization: Secondary | ICD-10-CM | POA: Diagnosis not present

## 2020-02-10 DIAGNOSIS — Z0000 Encounter for general adult medical examination without abnormal findings: Secondary | ICD-10-CM

## 2020-02-10 DIAGNOSIS — I10 Essential (primary) hypertension: Secondary | ICD-10-CM

## 2020-02-10 DIAGNOSIS — N289 Disorder of kidney and ureter, unspecified: Secondary | ICD-10-CM

## 2020-02-10 DIAGNOSIS — E782 Mixed hyperlipidemia: Secondary | ICD-10-CM | POA: Diagnosis not present

## 2020-02-10 MED ORDER — NIFEDIPINE ER OSMOTIC RELEASE 90 MG PO TB24: ORAL_TABLET | ORAL | 1 refills | Status: DC

## 2020-02-10 MED ORDER — ROSUVASTATIN CALCIUM 20 MG PO TABS
20.0000 mg | ORAL_TABLET | Freq: Every day | ORAL | 1 refills | Status: DC
Start: 2020-02-10 — End: 2020-10-05

## 2020-02-10 MED ORDER — DOXAZOSIN MESYLATE 4 MG PO TABS: ORAL_TABLET | ORAL | 1 refills | Status: DC

## 2020-02-10 MED ORDER — LOSARTAN POTASSIUM 100 MG PO TABS
100.0000 mg | ORAL_TABLET | Freq: Every day | ORAL | 1 refills | Status: DC
Start: 2020-02-10 — End: 2020-04-10

## 2020-02-10 MED ORDER — METOPROLOL SUCCINATE ER 50 MG PO TB24: ORAL_TABLET | ORAL | 1 refills | Status: DC

## 2020-02-10 MED ORDER — INDAPAMIDE 1.25 MG PO TABS: ORAL_TABLET | ORAL | 1 refills | Status: DC

## 2020-02-10 NOTE — Progress Notes (Signed)
   Subjective:    Patient ID: [name] [name], male    DOB: 1955-04-30, 64 y.o.   MRN: [id number]  HPI The patient comes in today for a wellness visit.   Routine general medical examination at a health care facility  Need for vaccination - Plan: Pneumococcal polysaccharide vaccine 23-valent greater than or equal to 2yo subcutaneous/IM  Essential hypertension, benign  Renal insufficiency  Mixed hyperlipidemia  A review of their health history was completed.  A review of medications was also completed.  Any needed refills; none  Eating habits: not health conscious Patient denies any depression currently.  States no major setbacks recently. Falls/  MVA accidents in past few months: none  Regular exercise: walking and lifting weights 3 days a week   Specialist pt sees on regular basis: none  Preventative health issues were discussed.   Additional concerns: none    Review of Systems  Constitutional: Negative for activity change.  HENT: Negative for congestion and rhinorrhea.   Respiratory: Negative for cough and shortness of breath.   Cardiovascular: Negative for chest pain.  Gastrointestinal: Negative for abdominal pain, diarrhea, nausea and vomiting.  Genitourinary: Negative for dysuria and hematuria.  Neurological: Negative for weakness and headaches.  Psychiatric/Behavioral: Negative for behavioral problems and confusion.   [facility]    Objective:   Physical Exam Vitals reviewed.  Constitutional:      General: He is not in acute distress. HENT:     Head: Normocephalic and atraumatic.  Eyes:     General:        Right eye: No discharge.        Left eye: No discharge.  Neck:     Trachea: No tracheal deviation.  Cardiovascular:     Rate and Rhythm: Normal rate and regular rhythm.     Heart sounds: Normal heart sounds. No murmur heard.   Pulmonary:     Effort: Pulmonary effort is normal. No respiratory distress.      Breath sounds: Normal breath sounds.  Lymphadenopathy:     Cervical: No cervical adenopathy.  Skin:    General: Skin is warm and dry.  Neurological:     Mental Status: He is alert.     Coordination: Coordination normal.  Psychiatric:        Behavior: Behavior normal.    Prostate exam not indicated patient has had this removed years ago       Assessment & Plan:  1. Routine general medical examination at a health care facility Adult wellness-complete.wellness physical was conducted today. Importance of diet and exercise were discussed in detail.  In addition to this a discussion regarding safety was also covered. We also reviewed over immunizations and gave recommendations regarding current immunization needed for age.  In addition to this additional areas were also touched on including: Preventative health exams needed:  Colonoscopy next 05/10/2021  Patient was advised yearly wellness exam   2. Need for vaccination Pneumococcal shot today - Pneumococcal polysaccharide vaccine 23-valent greater than or equal to 2yo subcutaneous/IM  3. Essential hypertension, benign Blood pressure good control continue current medications  4. Renal insufficiency Renal insufficiency improved continue current measures  5. Mixed hyperlipidemia Cholesterol good control continue current measures  Follow-up 6 months

## 2020-04-10 ENCOUNTER — Other Ambulatory Visit: Payer: Self-pay | Admitting: Family Medicine

## 2020-04-17 ENCOUNTER — Other Ambulatory Visit: Payer: Self-pay | Admitting: Family Medicine

## 2020-05-02 IMAGING — MR MR SHOULDER*R* W/O CM
5 series · 35 of 40 positions shown · non-contrast
Comparison: Right shoulder x-rays dated September 28, 2017.

CLINICAL DATA: Chronic shoulder pain and limited range of motion.

EXAM:
MRI OF THE RIGHT SHOULDER WITHOUT CONTRAST
TECHNIQUE: Multiplanar, multisequence MR imaging of the shoulder was performed.
No intravenous contrast was administered.

[Series 3: PD fat-sat · axial · 4.0mm · 0.59mm/px · z∈[-37,+76]mm · 8 of 25 slices shown (1 of 2)]
[im 1/25]
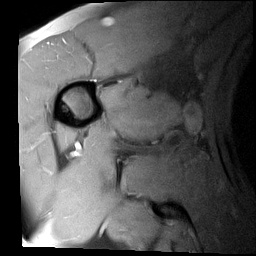
[im 4/25]
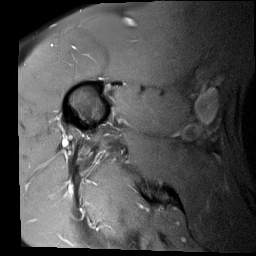
[im 7/25]
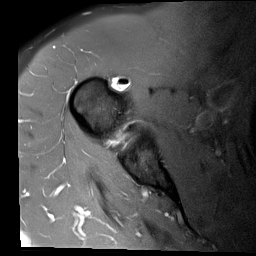
[im 11/25]
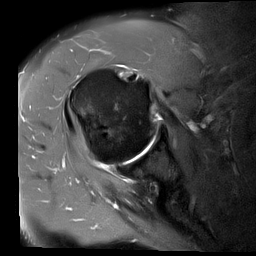
[im 14/25]
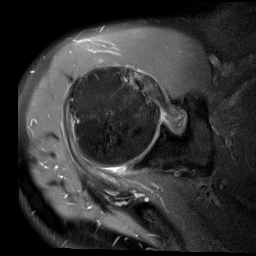
[im 18/25]
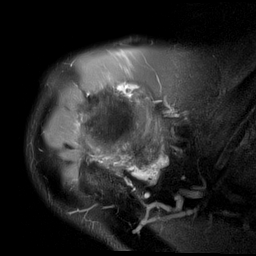
[im 21/25]
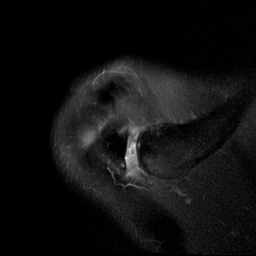
[im 25/25]
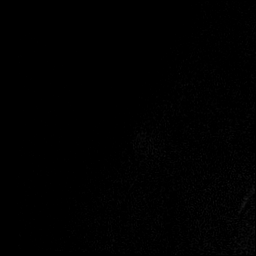

[Series 4: T1 · oblique · 4.0mm · 0.29mm/px · 3 of 27 slices shown]
[im 1/27]
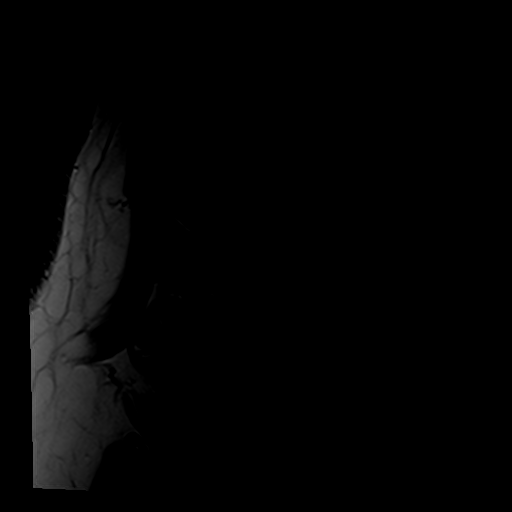
[im 4/27]
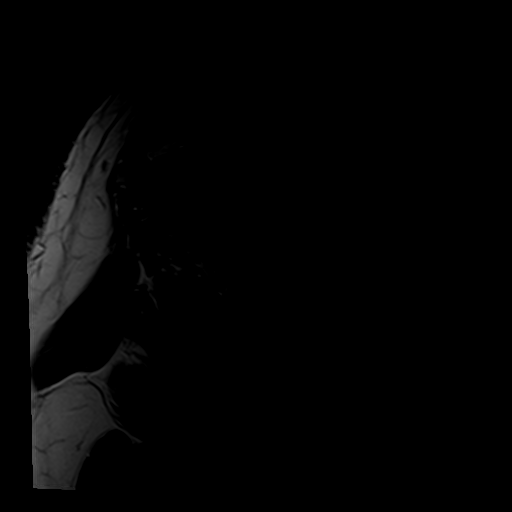
[im 8/27]
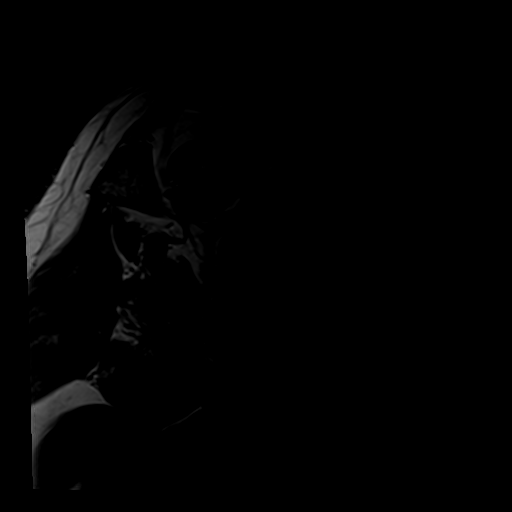

[Series 5: T2 fat-sat · oblique · 4.0mm · 0.59mm/px · 8 of 27 slices shown (1 of 2)]
[im 1/27]
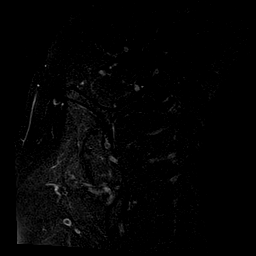
[im 4/27]
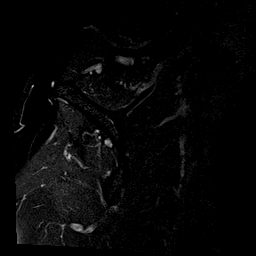
[im 8/27]
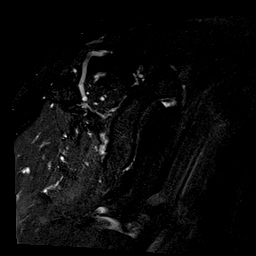
[im 12/27]
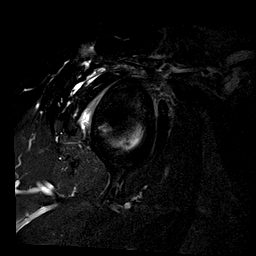
[im 15/27]
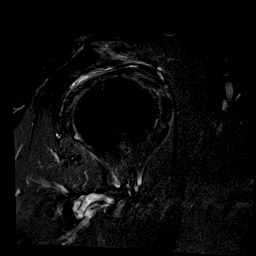
[im 19/27]
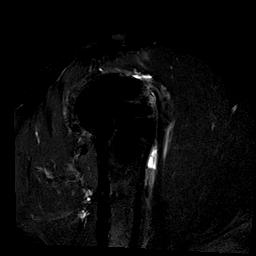
[im 23/27]
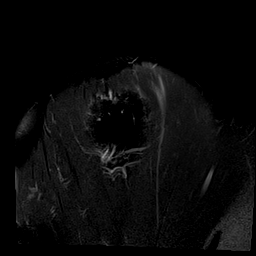
[im 27/27]
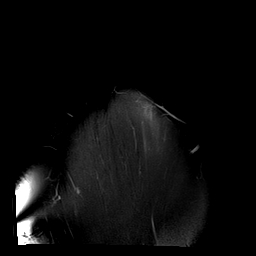

[Series 6: PD fat-sat · oblique · 4.0mm · 0.29mm/px · 8 of 29 slices shown (2 of 2)]
[im 1/29]
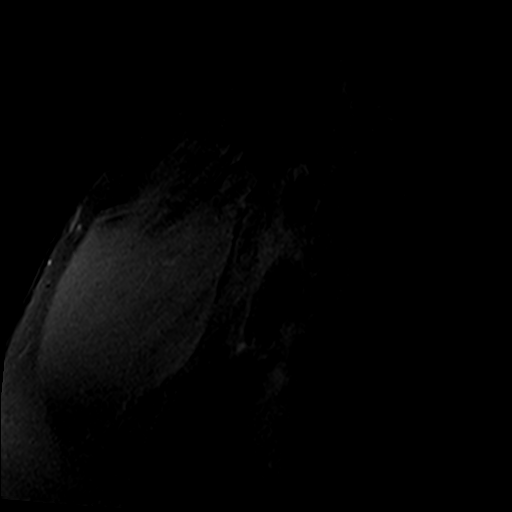
[im 5/29]
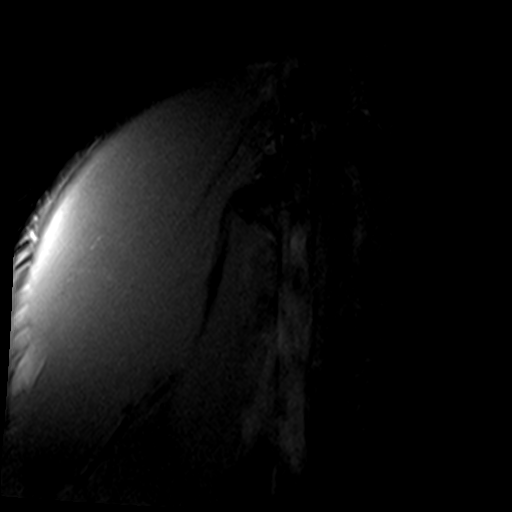
[im 9/29]
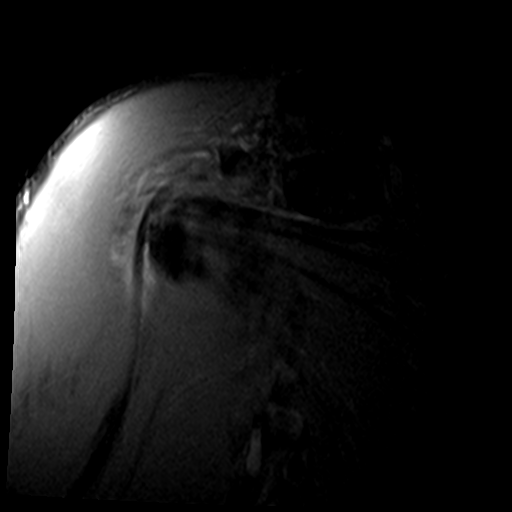
[im 13/29]
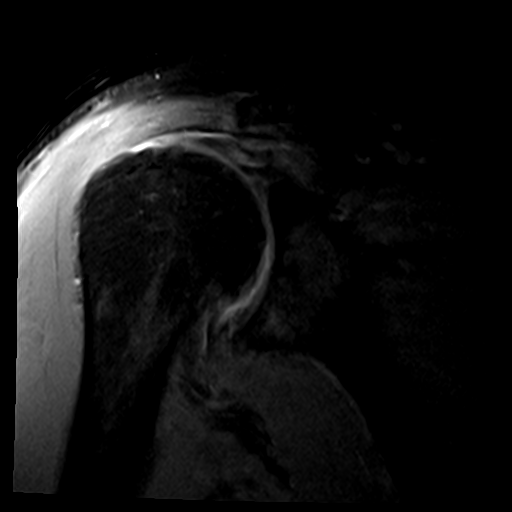
[im 17/29]
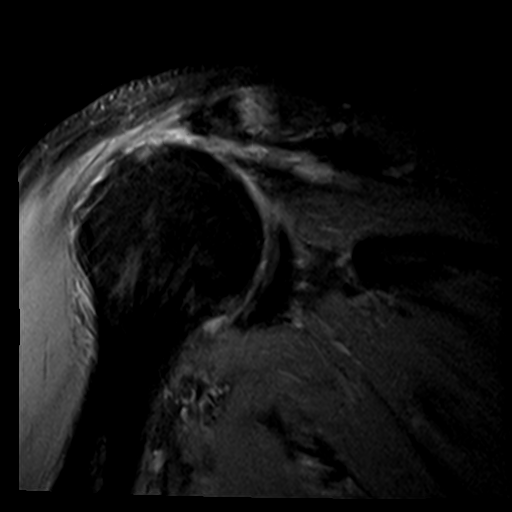
[im 21/29]
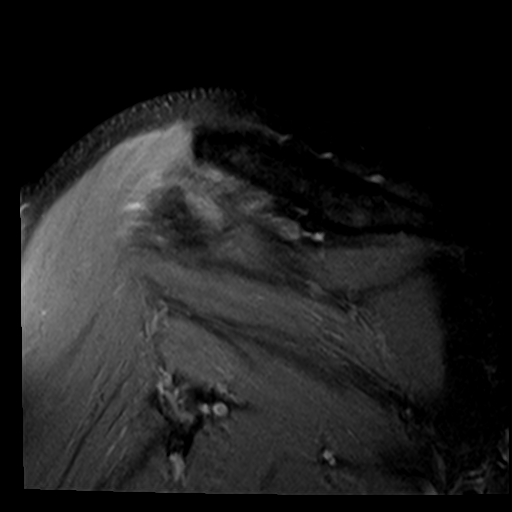
[im 25/29]
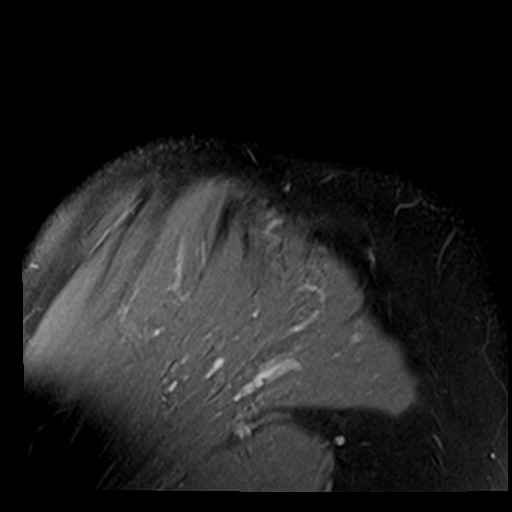
[im 29/29]
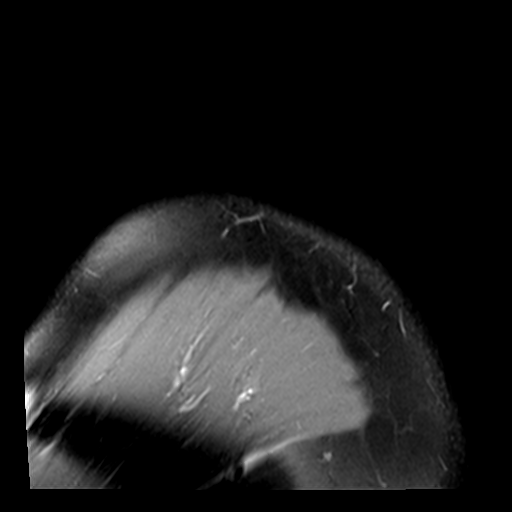

[Series 7: T2 fat-sat · oblique · 4.0mm · 0.59mm/px · 8 of 29 slices shown (2 of 2)]
[im 1/29]
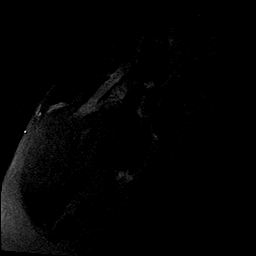
[im 5/29]
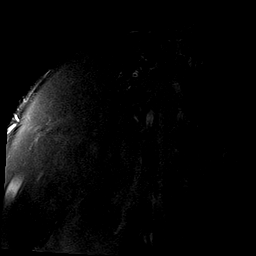
[im 9/29]
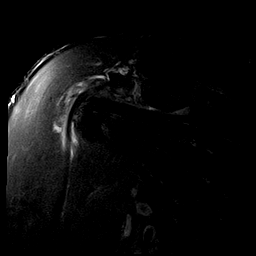
[im 13/29]
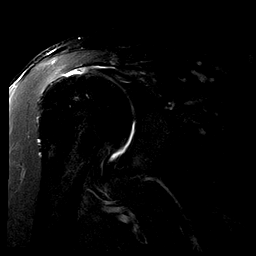
[im 17/29]
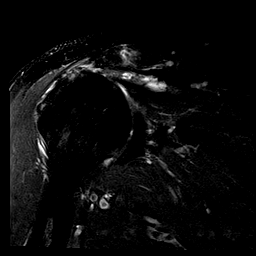
[im 21/29]
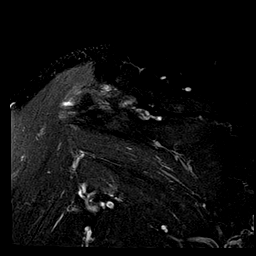
[im 25/29]
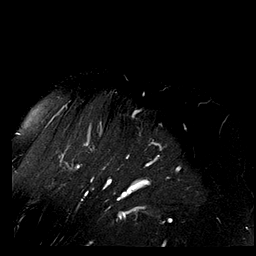
[im 29/29]
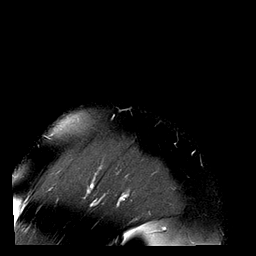

[35 of 40 positions shown; findings below may reference images not displayed]

FINDINGS: Rotator cuff: Severe supraspinatus tendinosis with focal
full-thickness, partial width tear of the anterior tendon fibers
near the footprint, with 1.6 cm retraction. Infraspinatus tendinosis
with full-thickness, partial width tear of the anterior
infraspinatus tendon with retraction to the mid humeral head.
Subscapularis tendinosis without tear. The teres minor tendon is
unremarkable.

Muscles:  Mild infraspinatus muscle atrophy.  No muscle edema.

Biceps long head: Moderate intra-articular tendinosis. Intact and
normally positioned.

Acromioclavicular Joint: Moderate arthropathy of the
acromioclavicular joint. Type II acromion. Small
subacromial/subdeltoid bursal fluid.

Glenohumeral Joint: Mild degenerative changes.  No joint effusion.

Labrum: Grossly intact, but evaluation is limited by lack of
intraarticular fluid.

Bones:  No marrow abnormality, fracture or dislocation.

Other: None.
IMPRESSION: 1. Full-thickness, partial width tears of the supraspinatus and
infraspinatus tendons. Mild infraspinatus muscle atrophy.
2. Subscapularis and intra-articular biceps tendinosis.
3. Moderate acromioclavicular and mild glenohumeral osteoarthritis.

## 2020-05-30 ENCOUNTER — Telehealth: Payer: Self-pay | Admitting: Family Medicine

## 2020-05-30 NOTE — Telephone Encounter (Signed)
Pharmacy requesting refill on Potassium 20 MeQ ER tablets. Take 3 tablets po every morning and evening. Pt last seen 02/10/20 for wellness. Please advise. Thank you

## 2020-05-31 MED ORDER — POTASSIUM CHLORIDE CRYS ER 20 MEQ PO TBCR: EXTENDED_RELEASE_TABLET | ORAL | 1 refills | Status: DC

## 2020-05-31 NOTE — Addendum Note (Signed)
Addended by: Dairl Ponder on: 05/31/2020 09:58 AM   Modules accepted: Orders

## 2020-05-31 NOTE — Telephone Encounter (Signed)
Prescription sent electronically to pharmacy. 

## 2020-05-31 NOTE — Telephone Encounter (Signed)
May have 6 months on this

## 2020-08-06 ENCOUNTER — Telehealth: Payer: Self-pay | Admitting: Family Medicine

## 2020-08-06 ENCOUNTER — Encounter: Payer: Self-pay | Admitting: Family Medicine

## 2020-08-06 NOTE — Telephone Encounter (Signed)
error 

## 2020-08-10 ENCOUNTER — Other Ambulatory Visit: Payer: Self-pay

## 2020-08-10 ENCOUNTER — Ambulatory Visit (INDEPENDENT_AMBULATORY_CARE_PROVIDER_SITE_OTHER): Payer: PRIVATE HEALTH INSURANCE | Admitting: Family Medicine

## 2020-08-10 VITALS — BP 136/76 | HR 69 | Temp 97.6°F | Ht 73.5 in | Wt 252.0 lb

## 2020-08-10 DIAGNOSIS — N1831 Chronic kidney disease, stage 3a: Secondary | ICD-10-CM

## 2020-08-10 DIAGNOSIS — Z1211 Encounter for screening for malignant neoplasm of colon: Secondary | ICD-10-CM | POA: Diagnosis not present

## 2020-08-10 DIAGNOSIS — D509 Iron deficiency anemia, unspecified: Secondary | ICD-10-CM | POA: Insufficient documentation

## 2020-08-10 DIAGNOSIS — I10 Essential (primary) hypertension: Secondary | ICD-10-CM | POA: Diagnosis not present

## 2020-08-10 NOTE — Progress Notes (Signed)
   Subjective:    Patient ID: [name] [name], male    DOB: 04/21/1956, 65 y.o.   MRN: [id number]  Hypertension This is a chronic problem. Pertinent negatives include no chest pain, headaches or shortness of breath. Treatments tried: losartan, carvedilol, cardura, procardia.  Iron deficiency anemia, unspecified iron deficiency anemia type  Essential hypertension, benign  Stage 3a chronic kidney disease ([place])  Screening for colon cancer - Plan: IFOBT POC (occult bld, rslt in office)   We did discuss healthy diet watching intake of protein he is on some supplements I told him to stop taking the amino acid supplements.  He is having an ultrasound through the [place] coming up his ferritin was low he is due for colonoscopy next year we will check stool test for blood   Review of Systems  Constitutional: Negative for activity change.  HENT: Negative for congestion and rhinorrhea.   Respiratory: Negative for cough and shortness of breath.   Cardiovascular: Negative for chest pain.  Gastrointestinal: Negative for abdominal pain, diarrhea, nausea and vomiting.  Genitourinary: Negative for dysuria and hematuria.  Neurological: Negative for weakness and headaches.  Psychiatric/Behavioral: Negative for behavioral problems and confusion.       Objective:   Physical Exam Vitals reviewed.  Constitutional:      General: He is not in acute distress. HENT:     Head: Normocephalic and atraumatic.  Eyes:     General:        Right eye: No discharge.        Left eye: No discharge.  Neck:     Trachea: No tracheal deviation.  Cardiovascular:     Rate and Rhythm: Normal rate and regular rhythm.     Heart sounds: Normal heart sounds. No murmur heard.   Pulmonary:     Effort: Pulmonary effort is normal. No respiratory distress.     Breath sounds: Normal breath sounds.  Lymphadenopathy:     Cervical: No cervical adenopathy.  Skin:    General: Skin is warm and dry.  Neurological:     Mental  Status: He is alert.     Coordination: Coordination normal.  Psychiatric:        Behavior: Behavior normal.           Assessment & Plan:  1. Iron deficiency anemia, unspecified iron deficiency anemia type His ferritin was low when it was checked at the [place] we need to check a stool test for blood May need to do colonoscopy sooner await results - IFOBT POC (occult bld, rslt in office); Future  2. Essential hypertension, benign Blood pressure decent control but patient needs to back off on amino acid supplements.  Continue his other medications.  3. Stage 3a chronic kidney disease ([place]) We did discuss this in detail he is getting an ultrasound through the [place] he will share the results with with [place] when he comes back need to aggressively keep blood pressure under good control continue SGLT2 if worsening condition  4. Screening for colon cancer Will do stool test for blood scheduled to have a colonoscopy next year - IFOBT POC (occult bld, rslt in office); Future  Follow-up again in 3 months

## 2020-09-06 ENCOUNTER — Other Ambulatory Visit: Payer: Self-pay

## 2020-09-10 ENCOUNTER — Other Ambulatory Visit: Payer: Self-pay | Admitting: *Deleted

## 2020-09-10 ENCOUNTER — Telehealth: Payer: Self-pay

## 2020-09-10 MED ORDER — POTASSIUM CHLORIDE CRYS ER 20 MEQ PO TBCR: EXTENDED_RELEASE_TABLET | ORAL | 1 refills | Status: DC

## 2020-09-10 NOTE — Telephone Encounter (Signed)
Refills sent per protocol and pt was notified.  

## 2020-09-10 NOTE — Telephone Encounter (Signed)
Pt needs refill on potassium chloride SA (KLOR-CON) 20 MEQ tablet [id number] - [place], [place] - [address]   Pt call back [phone]

## 2020-09-26 ENCOUNTER — Other Ambulatory Visit: Payer: Self-pay

## 2020-09-26 ENCOUNTER — Ambulatory Visit (INDEPENDENT_AMBULATORY_CARE_PROVIDER_SITE_OTHER): Payer: PRIVATE HEALTH INSURANCE | Admitting: Orthopedic Surgery

## 2020-09-26 ENCOUNTER — Ambulatory Visit: Payer: PRIVATE HEALTH INSURANCE

## 2020-09-26 DIAGNOSIS — Z8546 Personal history of malignant neoplasm of prostate: Secondary | ICD-10-CM | POA: Insufficient documentation

## 2020-09-26 DIAGNOSIS — Z86010 Personal history of colonic polyps: Secondary | ICD-10-CM | POA: Insufficient documentation

## 2020-09-26 DIAGNOSIS — M67912 Unspecified disorder of synovium and tendon, left shoulder: Secondary | ICD-10-CM

## 2020-09-26 DIAGNOSIS — M67911 Unspecified disorder of synovium and tendon, right shoulder: Secondary | ICD-10-CM | POA: Diagnosis not present

## 2020-09-26 DIAGNOSIS — E669 Obesity, unspecified: Secondary | ICD-10-CM | POA: Insufficient documentation

## 2020-09-26 DIAGNOSIS — J309 Allergic rhinitis, unspecified: Secondary | ICD-10-CM | POA: Insufficient documentation

## 2020-09-26 DIAGNOSIS — J45909 Unspecified asthma, uncomplicated: Secondary | ICD-10-CM | POA: Insufficient documentation

## 2020-09-26 DIAGNOSIS — Z7189 Other specified counseling: Secondary | ICD-10-CM | POA: Insufficient documentation

## 2020-09-26 DIAGNOSIS — K219 Gastro-esophageal reflux disease without esophagitis: Secondary | ICD-10-CM | POA: Insufficient documentation

## 2020-09-26 DIAGNOSIS — C61 Malignant neoplasm of prostate: Secondary | ICD-10-CM | POA: Insufficient documentation

## 2020-09-26 NOTE — Progress Notes (Signed)
Follow-up  Assessment and plan  Encounter Diagnoses  Name Primary?  . Rotator cuff dysfunction, right Yes  . Rotator cuff dysfunction, left     He has functional shoulders bilaterally without pain so we will make his follow-up as needed  Chief Complaint  Patient presents with  . 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  He is here for 1 year follow-up  He had an MRI back in 2019 which showed he had a partial width tear anterior supraspinatus tendon with focal areas of full-thickness tear and 1.5 cm retraction infraspinatus tendinosis full-thickness tear with retraction to the mid humeral head  In 2014 MRI  1.  Small full-thickness retracted supraspinatus tendon tear  anteriorly.  There is 13 mm of retraction and the tear is 14 mm  wide.  2.  Moderate infraspinatus tendinopathy.  3.  Intact biceps tendon and glenoid labra.  4.  Moderate to advanced AC joint degenerative changes and mild  undersurface spurring.    Past Surgical History:  Procedure Laterality Date  . COLONOSCOPY  11/15/2010   Dr. Fields:Pedunculated polyp/pan-colonic  TICS/mod IH. tubular adenoma  . COLONOSCOPY  2007   Dr. Oneida Alar: 3 mm tubular adeoma  . COLONOSCOPY N/A 03/08/2014   Procedure: COLONOSCOPY;  Surgeon: Danie Binder, MD;  [facility];  Service: Endoscopy;  Laterality: N/A;  100 - moved to 12:45 - Ginger to notify pt  . COLONOSCOPY N/A 10/17/2016   Procedure: COLONOSCOPY;  Surgeon: Danie Binder, MD;  Location: [facility];  Service: Endoscopy;  Laterality: N/A;  9:00am  . KNEE ARTHROSCOPY W/ MENISCECTOMY  2011 RIGHT  . LAMINECTOMY AND MICRODISCECTOMY LUMBAR SPINE  2002  . PROSTATECTOMY     Physical Exam Vitals and nursing note reviewed.  Constitutional:      General: He is not in acute distress.    Appearance: Normal appearance.  HENT:     Head: Normocephalic and atraumatic.  Eyes:     Extraocular  Movements: Extraocular movements intact.     Conjunctiva/sclera: Conjunctivae normal.     Pupils: Pupils are equal, round, and reactive to light.  Musculoskeletal:     Comments: Rt shoulder: No tenderness near from but weakness abduction and flexion   Left Shoulder: No tenderness Near full rom but 4/5     Skin:    General: Skin is warm and dry.     Capillary Refill: Capillary refill takes less than 2 seconds.  Neurological:     General: No focal deficit present.     Mental Status: He is alert and oriented to person, place, and time.  Psychiatric:        Mood and Affect: Mood normal.        Behavior: Behavior normal.        Thought Content: Thought content normal.        Judgment: Judgment normal.

## 2020-10-02 ENCOUNTER — Other Ambulatory Visit: Payer: Self-pay

## 2020-10-02 DIAGNOSIS — D509 Iron deficiency anemia, unspecified: Secondary | ICD-10-CM

## 2020-10-02 DIAGNOSIS — Z1211 Encounter for screening for malignant neoplasm of colon: Secondary | ICD-10-CM

## 2020-10-02 LAB — IFOBT (OCCULT BLOOD): IFOBT: NEGATIVE

## 2020-10-05 ENCOUNTER — Other Ambulatory Visit: Payer: Self-pay

## 2020-10-05 ENCOUNTER — Ambulatory Visit (INDEPENDENT_AMBULATORY_CARE_PROVIDER_SITE_OTHER): Payer: PRIVATE HEALTH INSURANCE | Admitting: Family Medicine

## 2020-10-05 VITALS — BP 126/78 | HR 67 | Ht 73.5 in | Wt 256.2 lb

## 2020-10-05 DIAGNOSIS — R6882 Decreased libido: Secondary | ICD-10-CM | POA: Diagnosis not present

## 2020-10-05 DIAGNOSIS — I10 Essential (primary) hypertension: Secondary | ICD-10-CM

## 2020-10-05 DIAGNOSIS — N529 Male erectile dysfunction, unspecified: Secondary | ICD-10-CM

## 2020-10-05 DIAGNOSIS — N289 Disorder of kidney and ureter, unspecified: Secondary | ICD-10-CM | POA: Diagnosis not present

## 2020-10-05 DIAGNOSIS — M19019 Primary osteoarthritis, unspecified shoulder: Secondary | ICD-10-CM

## 2020-10-05 MED ORDER — ROSUVASTATIN CALCIUM 20 MG PO TABS: 20.0000 mg | ORAL_TABLET | Freq: Every day | ORAL | 1 refills | Status: DC

## 2020-10-05 MED ORDER — INDAPAMIDE 1.25 MG PO TABS: ORAL_TABLET | ORAL | 1 refills | Status: DC

## 2020-10-05 MED ORDER — NIFEDIPINE ER OSMOTIC RELEASE 90 MG PO TB24: ORAL_TABLET | ORAL | 1 refills | Status: DC

## 2020-10-05 MED ORDER — DOXAZOSIN MESYLATE 4 MG PO TABS: ORAL_TABLET | ORAL | 1 refills | Status: DC

## 2020-10-05 MED ORDER — LOSARTAN POTASSIUM 100 MG PO TABS: ORAL_TABLET | ORAL | 1 refills | Status: DC

## 2020-10-05 NOTE — Progress Notes (Signed)
   Subjective:    Patient ID: [name] [name], male    DOB: 05-Sep-1955, 65 y.o.   MRN: [id number]  Hypertension This is a chronic problem. The current episode started more than 1 year ago. Risk factors for coronary artery disease include male gender and dyslipidemia. Treatments tried: coreg, cardura, lozol, cozaar, procardia. There are no compliance problems.    Patient has not had his results forwarded to him from the [place] regarding his ultrasound when it does happen he will send it to [place]  Patient has bony prominence in the left shoulder and states that it got bigger than it got smaller he has bilateral shoulder arthritis.  Denies any other major issues with that  Does relate erectile dysfunction and has had previous prostatectomy due to prostate cancer  Relates a lot of fatigue and tiredness and low sex drive  Review of Systems     Objective:   Physical Exam  General-in no acute distress Eyes-no discharge Lungs-respiratory rate normal, CTA CV-no murmurs,RRR Extremities skin warm dry no edema Neuro grossly normal Behavior normal, alert  Mild crepitus in both shoulders     Assessment & Plan:  1. Essential hypertension, benign Patient will forward to [place] the ultrasound once it is back he will also continue his medications blood pressure very good  2. Renal insufficiency [facility] he will send [place] updates on the blood work  3. Erectile dysfunction, unspecified erectile dysfunction type Patient defers on Cialis he will talk with his urologist about injections currently using vacuum pump  4. Low libido We did discuss checking testosterone if he is interested he was on testosterone previously with the urologist no longer doing this.  Personally I am not a big fan given his history of prostate cancer but he has had no reoccurrence so far-if he would like for [place] to order testosterone he will let us know  5. Shoulder arthritis Arthritis of the shoulders  reassurance given patient was told if he gets worse we can help send him to orthopedics but the knot he is feeling is more of a bony prominence

## 2020-10-09 ENCOUNTER — Other Ambulatory Visit: Payer: Self-pay

## 2020-10-09 DIAGNOSIS — D509 Iron deficiency anemia, unspecified: Secondary | ICD-10-CM

## 2020-10-09 NOTE — Progress Notes (Signed)
Patient has been made aware of his test results, patient would like have labs done through this office. Patient requested getting testosterone levels checked in addition to iron studies. Please advise

## 2020-10-10 ENCOUNTER — Other Ambulatory Visit: Payer: Self-pay

## 2020-10-10 ENCOUNTER — Encounter: Payer: Self-pay | Admitting: Family Medicine

## 2020-10-10 DIAGNOSIS — D509 Iron deficiency anemia, unspecified: Secondary | ICD-10-CM

## 2020-10-10 DIAGNOSIS — R6882 Decreased libido: Secondary | ICD-10-CM

## 2020-10-10 DIAGNOSIS — N1831 Chronic kidney disease, stage 3a: Secondary | ICD-10-CM

## 2020-10-27 LAB — CBC WITH DIFFERENTIAL/PLATELET
Basophils Absolute: 0 10*3/uL (ref 0.0–0.2)
Basos: 1 %
EOS (ABSOLUTE): 0.1 10*3/uL (ref 0.0–0.4)
Eos: 2 %
Hematocrit: 39.2 % (ref 37.5–51.0)
Hemoglobin: 13.2 g/dL (ref 13.0–17.7)
Immature Grans (Abs): 0 10*3/uL (ref 0.0–0.1)
Immature Granulocytes: 0 %
Lymphocytes Absolute: 1.2 10*3/uL (ref 0.7–3.1)
Lymphs: 37 %
MCH: 29.7 pg (ref 26.6–33.0)
MCHC: 33.7 g/dL (ref 31.5–35.7)
MCV: 88 fL (ref 79–97)
Monocytes Absolute: 0.3 10*3/uL (ref 0.1–0.9)
Monocytes: 9 %
Neutrophils Absolute: 1.6 10*3/uL (ref 1.4–7.0)
Neutrophils: 51 %
Platelets: 200 10*3/uL (ref 150–450)
RBC: 4.45 x10E6/uL (ref 4.14–5.80)
RDW: 13.7 % (ref 11.6–15.4)
WBC: 3.2 10*3/uL — ABNORMAL LOW (ref 3.4–10.8)

## 2020-10-27 LAB — IRON AND TIBC
Iron Saturation: 32 % (ref 15–55)
Iron: 75 ug/dL (ref 38–169)
Total Iron Binding Capacity: 233 ug/dL — ABNORMAL LOW (ref 250–450)
UIBC: 158 ug/dL (ref 111–343)

## 2020-10-27 LAB — FERRITIN: Ferritin: 91 ng/mL (ref 30–400)

## 2020-10-27 LAB — TESTOSTERONE: Testosterone: 442 ng/dL (ref 264–916)

## 2020-11-08 ENCOUNTER — Other Ambulatory Visit: Payer: Self-pay | Admitting: Family Medicine

## 2020-11-08 DIAGNOSIS — E782 Mixed hyperlipidemia: Secondary | ICD-10-CM

## 2020-12-06 ENCOUNTER — Other Ambulatory Visit: Payer: Self-pay | Admitting: Family Medicine

## 2020-12-13 ENCOUNTER — Other Ambulatory Visit: Payer: Self-pay | Admitting: Family Medicine

## 2020-12-18 ENCOUNTER — Telehealth: Payer: Self-pay | Admitting: Family Medicine

## 2020-12-18 ENCOUNTER — Other Ambulatory Visit: Payer: Self-pay

## 2020-12-18 DIAGNOSIS — Z8639 Personal history of other endocrine, nutritional and metabolic disease: Secondary | ICD-10-CM

## 2020-12-18 MED ORDER — POTASSIUM CHLORIDE CRYS ER 20 MEQ PO TBCR: EXTENDED_RELEASE_TABLET | ORAL | 0 refills | Status: DC

## 2020-12-18 NOTE — Telephone Encounter (Signed)
[facility] [address] requesting 90 day supply (540 tablets) of Potassium 20 mEq. Take 3 tablets po every evening and morning. Pt last seen 10/05/20 for HTN. Please advise. Thank you

## 2020-12-18 NOTE — Telephone Encounter (Signed)
Pt informed per drs notes

## 2020-12-18 NOTE — Telephone Encounter (Signed)
May have 90-day with refill Needs metabolic 7 and magnesium due to history of hypokalemia

## 2020-12-22 LAB — BASIC METABOLIC PANEL
BUN/Creatinine Ratio: 12 (ref 10–24)
BUN: 17 mg/dL (ref 8–27)
CO2: 27 mmol/L (ref 20–29)
Calcium: 8.9 mg/dL (ref 8.6–10.2)
Chloride: 105 mmol/L (ref 96–106)
Creatinine, Ser: 1.43 mg/dL — ABNORMAL HIGH (ref 0.76–1.27)
Glucose: 95 mg/dL (ref 65–99)
Potassium: 3.9 mmol/L (ref 3.5–5.2)
Sodium: 144 mmol/L (ref 134–144)
eGFR: 54 mL/min/{1.73_m2} — ABNORMAL LOW (ref >59)

## 2020-12-22 LAB — MAGNESIUM: Magnesium: 2.1 mg/dL (ref 1.6–2.3)

## 2021-01-15 ENCOUNTER — Other Ambulatory Visit: Payer: Self-pay | Admitting: Family Medicine

## 2021-02-04 ENCOUNTER — Other Ambulatory Visit (HOSPITAL_COMMUNITY): Payer: Self-pay

## 2021-02-04 MED ORDER — INFLUENZA VAC A&B SA ADJ QUAD 0.5 ML IM PRSY
PREFILLED_SYRINGE | INTRAMUSCULAR | 0 refills | Status: DC
  Filled 2021-02-04: qty 0.5, 1d supply, fill #0

## 2021-02-12 ENCOUNTER — Ambulatory Visit (INDEPENDENT_AMBULATORY_CARE_PROVIDER_SITE_OTHER): Payer: No Typology Code available for payment source | Admitting: Family Medicine

## 2021-02-12 ENCOUNTER — Other Ambulatory Visit: Payer: Self-pay

## 2021-02-12 VITALS — BP 130/84 | Temp 97.0°F | Ht 73.5 in | Wt 252.6 lb

## 2021-02-12 DIAGNOSIS — Z0000 Encounter for general adult medical examination without abnormal findings: Secondary | ICD-10-CM | POA: Diagnosis not present

## 2021-02-12 DIAGNOSIS — I10 Essential (primary) hypertension: Secondary | ICD-10-CM | POA: Diagnosis not present

## 2021-02-12 NOTE — Progress Notes (Signed)
   Subjective:    Patient ID: [name] [name], male    DOB: 1955-04-23, 65 y.o.   MRN: [id number]  HPI The patient comes in today for a wellness visit.  He is doing a good job of taking his medications He is trying to watch his diet Exercises on a regular basis Denies being depressed Bowel movements going well Followed in the past by urology but they have released him   A review of their health history was completed.  A review of medications was also completed.  Any needed refills; not at this time  Eating habits: healthy eating  Falls/  MVA accidents in past few months: none  Regular exercise: yes  Specialist pt sees on regular basis: no  Preventative health issues were discussed.   Additional concerns: none    Review of Systems     Objective:   Physical Exam  General-in no acute distress Eyes-no discharge Lungs-respiratory rate normal, CTA CV-no murmurs,RRR Extremities skin warm dry no edema Neuro grossly normal Behavior normal, alert       Assessment & Plan:   Blood pressure very good control continue current measures  Mild renal insufficiency consideration for Farxiga depending on how his blood pressure does kidney functions do and ongoing information regarding the usefulness of SGLT2  Wellness-safety dietary discussed Follow-up by spring time Follow-up sooner problems Adult wellness-complete.wellness physical was conducted today. Importance of diet and exercise were discussed in detail.  In addition to this a discussion regarding safety was also covered. We also reviewed over immunizations and gave recommendations regarding current immunization needed for age.  In addition to this additional areas were also touched on including: Preventative health exams needed:  Colonoscopy Next 1 is 2023 in June  Patient was advised yearly wellness exam

## 2021-03-14 ENCOUNTER — Other Ambulatory Visit: Payer: Self-pay | Admitting: Family Medicine

## 2021-03-14 DIAGNOSIS — E782 Mixed hyperlipidemia: Secondary | ICD-10-CM

## 2021-03-22 ENCOUNTER — Encounter: Payer: Self-pay | Admitting: Family Medicine

## 2021-03-22 DIAGNOSIS — M79676 Pain in unspecified toe(s): Secondary | ICD-10-CM

## 2021-03-22 NOTE — Telephone Encounter (Signed)
Podiatry referral please

## 2021-03-29 ENCOUNTER — Other Ambulatory Visit: Payer: Self-pay | Admitting: Family Medicine

## 2021-03-29 ENCOUNTER — Encounter: Payer: Self-pay | Admitting: Family Medicine

## 2021-03-29 NOTE — Telephone Encounter (Signed)
Patient is requesting prescription for Carvedilol 25 mg Called in [facility]

## 2021-03-30 ENCOUNTER — Other Ambulatory Visit: Payer: Self-pay | Admitting: Family Medicine

## 2021-03-31 ENCOUNTER — Encounter: Payer: Self-pay | Admitting: Family Medicine

## 2021-03-31 MED ORDER — CARVEDILOL 25 MG PO TABS: 25.0000 mg | ORAL_TABLET | Freq: Two times a day (BID) | ORAL | 1 refills | Status: DC

## 2021-04-01 NOTE — Telephone Encounter (Signed)
Nurses Please verify with patient-I imagine he is referring to the carvedilol what was sent in-25 mg 1 twice daily-[facility] half a tablet twice daily?  If that is how he is taking it please send in corrected prescription carvedilol 25 mg 1/2 tablet twice daily #90 with 1 refill  If I am misinterpreting the message please let me know thanks-Dr. Nicki Reaper

## 2021-04-09 ENCOUNTER — Encounter: Payer: Self-pay | Admitting: Podiatry

## 2021-04-09 ENCOUNTER — Other Ambulatory Visit: Payer: Self-pay

## 2021-04-09 ENCOUNTER — Ambulatory Visit (INDEPENDENT_AMBULATORY_CARE_PROVIDER_SITE_OTHER): Payer: No Typology Code available for payment source | Admitting: Podiatry

## 2021-04-09 ENCOUNTER — Other Ambulatory Visit: Payer: Self-pay | Admitting: Family Medicine

## 2021-04-09 DIAGNOSIS — B353 Tinea pedis: Secondary | ICD-10-CM | POA: Diagnosis not present

## 2021-04-09 DIAGNOSIS — M79675 Pain in left toe(s): Secondary | ICD-10-CM | POA: Diagnosis not present

## 2021-04-09 DIAGNOSIS — B351 Tinea unguium: Secondary | ICD-10-CM

## 2021-04-09 DIAGNOSIS — M79674 Pain in right toe(s): Secondary | ICD-10-CM

## 2021-04-09 MED ORDER — TERBINAFINE HCL 250 MG PO TABS: 250.0000 mg | ORAL_TABLET | Freq: Every day | ORAL | 0 refills | Status: AC

## 2021-04-09 NOTE — Progress Notes (Signed)
°  Subjective:  Patient ID: [name] [name], male    DOB: December 26, 1955,  MRN: [id number]  Chief Complaint  Patient presents with   Nail Problem    Bilateral thick painful toenails    65 y.o. male presents with the above complaint. History confirmed with patient.  Also notes itching in the skin with dry skin  Objective:  Physical Exam: warm, good capillary refill, no trophic changes or ulcerative lesions, normal DP and PT pulses, normal sensory exam, and tinea pedis. Left Foot: dystrophic yellowed discolored nail plates with subungual debris Right Foot: dystrophic yellowed discolored nail plates with subungual debris     Assessment:   1. Pain due to onychomycosis of toenails of both feet   2. Tinea pedis of both feet      Plan:  Patient was evaluated and treated and all questions answered.  Discussed the etiology and treatment options for the condition in detail with the patient. Educated patient on the topical and oral treatment options for mycotic nails and tinea pedis. Recommended debridement of the nails today. Sharp and mechanical debridement performed of all painful and mycotic nails today. Nails debrided in length and thickness using a nail nipper to level of comfort.  Recommended treatment 90-day Lamisil course this was sent to his pharmacy.  I will reevaluate in 3 months.  Photographs taken    Return in about 3 months (around 07/08/2021) for follow up after nail fungus treatment.

## 2021-04-16 ENCOUNTER — Telehealth: Payer: Self-pay | Admitting: Family Medicine

## 2021-04-16 MED ORDER — LOSARTAN POTASSIUM 100 MG PO TABS: ORAL_TABLET | ORAL | 1 refills | Status: DC

## 2021-04-16 NOTE — Telephone Encounter (Signed)
Medication sent in and patient contacted

## 2021-04-16 NOTE — Telephone Encounter (Signed)
Patient requesting a refill called into [facility] [address]  losartan (COZAAR) 100 MG tablet [[id number]]    Order Details Dose, Route, Frequency: As Directed  Dispense Quantity: 90 tablet Refills: 1        Sig: TAKE 1 TABLET(100 MG) BY MOUTH DAILY  CB# [phone]

## 2021-06-05 ENCOUNTER — Encounter: Payer: Self-pay | Admitting: Family Medicine

## 2021-06-05 NOTE — Telephone Encounter (Signed)
Nurses At [facility] I would recommend a follow-up visit to look at that area on his head  As for feeling under the weather with flulike symptoms I highly recommend that he do a COVID test.  If positive very important to let us know.  If ongoing sickness and problems otherwise we will be happy to see him as well at his discretion  Thanks-Dr. Nicki Reaper

## 2021-06-06 ENCOUNTER — Encounter: Payer: Self-pay | Admitting: Nurse Practitioner

## 2021-06-06 ENCOUNTER — Ambulatory Visit (INDEPENDENT_AMBULATORY_CARE_PROVIDER_SITE_OTHER): Payer: PRIVATE HEALTH INSURANCE | Admitting: Nurse Practitioner

## 2021-06-06 ENCOUNTER — Other Ambulatory Visit: Payer: Self-pay

## 2021-06-06 VITALS — BP 120/80 | HR 67 | Temp 97.0°F | Ht 73.5 in | Wt 250.0 lb

## 2021-06-06 DIAGNOSIS — L989 Disorder of the skin and subcutaneous tissue, unspecified: Secondary | ICD-10-CM | POA: Diagnosis not present

## 2021-06-06 DIAGNOSIS — D229 Melanocytic nevi, unspecified: Secondary | ICD-10-CM | POA: Diagnosis not present

## 2021-06-06 MED ORDER — TRIAMCINOLONE ACETONIDE 0.1 % EX CREA: 1.0000 "application " | TOPICAL_CREAM | Freq: Two times a day (BID) | CUTANEOUS | 0 refills | Status: DC

## 2021-06-06 NOTE — Progress Notes (Signed)
°  Subjective:    Patient ID: [name] [name], male    DOB: 11/24/1955, 66 y.o.   MRN: [id number]  HPI  Patient presents to clinic with concerns of a spot on his head noticed by his wife about 3 days ago.  Patient denies that the area itches or hurts.  Patient states that he has changed his soaps that he uses on his head and that he has not been using his baby oil and herbal all she like he normally does.    Patient also has concerns about having several moles and skin tags and would like to know what he could do about them.  Review of Systems  Skin:        Area on the top of his head  All other systems reviewed and are negative.     Objective:   Physical Exam Constitutional:      Appearance: Normal appearance. He is normal weight.  Cardiovascular:     Rate and Rhythm: Normal rate.     Heart sounds: No murmur heard. Pulmonary:     Effort: Pulmonary effort is normal.  Skin:    General: Skin is warm.     Capillary Refill: Capillary refill takes less than 2 seconds.     Comments: Patient has multiple nevi located to his torso.  Several pedunculated lesions also noted to patient's torso.  Neurological:     General: No focal deficit present.     Mental Status: He is alert and oriented to person, place, and time.  Psychiatric:        Mood and Affect: Mood normal.        Behavior: Behavior normal.          Assessment & Plan:  1. Skin lesion -Area likely dry skin versus dermatitis -Triamcinolone prescribed. -Patient instructed to contact clinic if triamcinolone makes the area worse.  If area becomes worsened with steroid cream use then I would have a suspicion for fungal infection. -Return to your normal skin moisture regimen.  Use oil or Vaseline as moisturizer for your scalp.  May continue using herbal oil sheen as needed. -Avoid harsh soaps on the scalp.  2. Multiple nevi -Patient would like a referral to dermatology to discuss management of skin tags and evaluation of  multiple nevi. -Discussed signs symptoms of skin cancer.  At this time nevi do not look concerning for cancer.  However due to having multiple nevi patient may benefit from more extensive evaluation. - Referral placed with Dr. Leonie Douglas or Dr. [place] Crane in [place]

## 2021-06-10 ENCOUNTER — Other Ambulatory Visit: Payer: Self-pay | Admitting: *Deleted

## 2021-06-10 DIAGNOSIS — Z8639 Personal history of other endocrine, nutritional and metabolic disease: Secondary | ICD-10-CM

## 2021-06-10 MED ORDER — POTASSIUM CHLORIDE CRYS ER 20 MEQ PO TBCR: EXTENDED_RELEASE_TABLET | ORAL | 0 refills | Status: DC

## 2021-06-18 ENCOUNTER — Ambulatory Visit: Payer: No Typology Code available for payment source | Admitting: Family Medicine

## 2021-07-09 ENCOUNTER — Ambulatory Visit (INDEPENDENT_AMBULATORY_CARE_PROVIDER_SITE_OTHER): Payer: PRIVATE HEALTH INSURANCE | Admitting: Podiatry

## 2021-07-09 ENCOUNTER — Other Ambulatory Visit: Payer: Self-pay | Admitting: Family Medicine

## 2021-07-09 ENCOUNTER — Other Ambulatory Visit: Payer: Self-pay

## 2021-07-09 DIAGNOSIS — B353 Tinea pedis: Secondary | ICD-10-CM

## 2021-07-09 DIAGNOSIS — M79674 Pain in right toe(s): Secondary | ICD-10-CM | POA: Diagnosis not present

## 2021-07-09 DIAGNOSIS — M79675 Pain in left toe(s): Secondary | ICD-10-CM | POA: Diagnosis not present

## 2021-07-09 DIAGNOSIS — B351 Tinea unguium: Secondary | ICD-10-CM | POA: Diagnosis not present

## 2021-07-09 MED ORDER — CLOTRIMAZOLE-BETAMETHASONE 1-0.05 % EX CREA: 1.0000 "application " | TOPICAL_CREAM | Freq: Every day | CUTANEOUS | 0 refills | Status: DC

## 2021-07-09 MED ORDER — TERBINAFINE HCL 250 MG PO TABS: 250.0000 mg | ORAL_TABLET | Freq: Every day | ORAL | 0 refills | Status: AC

## 2021-07-12 ENCOUNTER — Encounter: Payer: Self-pay | Admitting: Podiatry

## 2021-07-12 NOTE — Progress Notes (Signed)
?  Subjective:  ?Patient ID: [name] [name], male    DOB: Aug 27, 1955,  MRN: [id number] ? ?Chief Complaint  ?Patient presents with  ? Nail Problem  ?   nail fungus treatment/ bil Pain of toe  ? ? ?66 y.o. male presents with the above complaint. History confirmed with patient.  Notes quite a bit of improvement so far, still having dry itchy skin ? ?Objective:  ?Physical Exam: ?warm, good capillary refill, no trophic changes or ulcerative lesions, normal DP and PT pulses, normal sensory exam, and tinea pedis.  Approximately 50% clearance of onychomycosis noted in the photos below ?Left Foot: dystrophic yellowed discolored nail plates with subungual debris ?Right Foot: dystrophic yellowed discolored nail plates with subungual debris ? ? ? ? ? ? ? ?Assessment:  ? ?1. Pain due to onychomycosis of toenails of both feet   ?2. Tinea pedis of both feet   ? ? ? ? ?Plan:  ?Patient was evaluated and treated and all questions answered. ? ?Discussed the etiology and treatment options for the condition in detail with the patient. Educated patient on the topical and oral treatment options for mycotic nails and tinea pedis. Recommended debridement of the nails today. Sharp and mechanical debridement performed of all painful and mycotic nails today. Nails debrided in length and thickness using a nail nipper to level of comfort.  Recommended treatment 90-day Lamisil course this was sent to his pharmacy.  I will reevaluate in 3 months.  Photographs taken.  I also gave him Lotrisone for the tinea pedis centimeters of the etiology and treatment options of this.  I also recommended spraying the inside of his shoes with antifungal spray or powder and cleaning surfaces that he has barefoot such as the shower and tile flooring ? ? ? ?Return in about 4 months (around 11/08/2021) for follow up after nail fungus treatment.  ? ?

## 2021-08-13 ENCOUNTER — Ambulatory Visit (INDEPENDENT_AMBULATORY_CARE_PROVIDER_SITE_OTHER): Payer: PRIVATE HEALTH INSURANCE | Admitting: Family Medicine

## 2021-08-13 ENCOUNTER — Encounter: Payer: Self-pay | Admitting: Family Medicine

## 2021-08-13 VITALS — BP 130/76 | HR 61 | Temp 97.7°F | Wt 251.6 lb

## 2021-08-13 DIAGNOSIS — Z23 Encounter for immunization: Secondary | ICD-10-CM | POA: Diagnosis not present

## 2021-08-13 DIAGNOSIS — Z8639 Personal history of other endocrine, nutritional and metabolic disease: Secondary | ICD-10-CM

## 2021-08-13 DIAGNOSIS — E782 Mixed hyperlipidemia: Secondary | ICD-10-CM | POA: Diagnosis not present

## 2021-08-13 DIAGNOSIS — Z1211 Encounter for screening for malignant neoplasm of colon: Secondary | ICD-10-CM | POA: Diagnosis not present

## 2021-08-13 MED ORDER — DOXAZOSIN MESYLATE 4 MG PO TABS: ORAL_TABLET | ORAL | 1 refills | Status: DC

## 2021-08-13 MED ORDER — CARVEDILOL 25 MG PO TABS: 25.0000 mg | ORAL_TABLET | Freq: Two times a day (BID) | ORAL | 1 refills | Status: DC

## 2021-08-13 MED ORDER — NIFEDIPINE ER OSMOTIC RELEASE 90 MG PO TB24: ORAL_TABLET | ORAL | 1 refills | Status: DC

## 2021-08-13 MED ORDER — POTASSIUM CHLORIDE CRYS ER 20 MEQ PO TBCR: EXTENDED_RELEASE_TABLET | ORAL | 1 refills | Status: DC

## 2021-08-13 MED ORDER — LOSARTAN POTASSIUM 100 MG PO TABS: ORAL_TABLET | ORAL | 1 refills | Status: DC

## 2021-08-13 MED ORDER — INDAPAMIDE 1.25 MG PO TABS: ORAL_TABLET | ORAL | 1 refills | Status: DC

## 2021-08-13 MED ORDER — FAMOTIDINE 40 MG PO TABS: 40.0000 mg | ORAL_TABLET | Freq: Every day | ORAL | 1 refills | Status: DC

## 2021-08-13 MED ORDER — ROSUVASTATIN CALCIUM 20 MG PO TABS: ORAL_TABLET | ORAL | 1 refills | Status: DC

## 2021-08-13 NOTE — Patient Instructions (Signed)

## 2021-08-13 NOTE — Progress Notes (Signed)
? ?  Subjective:  ? ? Patient ID: [name] [name], male    DOB: [birth year], 66 y.o.   MRN: [id number] ? ?HPI ?Pt here to follow up on blood pressure. States pressure has been doing pretty good. No issues. ?Takes his medication ?Watches diet ?Stays physically active ?Does have lipid issues ?Takes his medication denies any issues.  Watches diet ?[facility] ?He will share this lab work with [place] ? ? ? Has been using Rolaids for acid reflux but would like to try prescription Pepcid.  ?Denies any dysphagia denies severe heartburn was has intermittent reflux issues would like to try prescription Pepcid to see if this would help ? ?Patient is trying to stay away from fried foods fatty foods ? ?He is trying to keep his weight down ? ?He is interested in Nitric oxide to help with flow to his penis he has erectile dysfunction ever since having prostate surgery-I am very skeptical that that medication would be of any benefit ? ?Refills given follow-up 6 months patient to get lab work through [place] and have that sent to [place] ? ?Review of Systems ? ?   ?Objective:  ? Physical Exam ?General-in no acute distress ?Eyes-no discharge ?Lungs-respiratory rate normal, CTA ?CV-no murmurs,RRR ?Extremities skin warm dry no edema ?Neuro grossly normal ?Behavior normal, alert ? ? ? ? ?   ?Assessment & Plan:  ? ? ?

## 2021-08-15 ENCOUNTER — Telehealth: Payer: Self-pay | Admitting: Family Medicine

## 2021-08-15 DIAGNOSIS — C61 Malignant neoplasm of prostate: Secondary | ICD-10-CM

## 2021-08-15 DIAGNOSIS — I10 Essential (primary) hypertension: Secondary | ICD-10-CM

## 2021-08-15 DIAGNOSIS — R7303 Prediabetes: Secondary | ICD-10-CM

## 2021-08-15 DIAGNOSIS — Z79899 Other long term (current) drug therapy: Secondary | ICD-10-CM

## 2021-08-15 DIAGNOSIS — E782 Mixed hyperlipidemia: Secondary | ICD-10-CM

## 2021-08-15 NOTE — Telephone Encounter (Signed)
Patient called wants you to put lab orders in because his appointment at [facility] was cancelled for Monday. He spoke with you on this at his appointment 4/26 ?

## 2021-08-16 NOTE — Telephone Encounter (Signed)
Patient notified

## 2021-08-16 NOTE — Telephone Encounter (Signed)
Lab orders placed. Left message to return call; also sent my chart message ?

## 2021-08-16 NOTE — Telephone Encounter (Signed)
Lipid, metabolic 7, liver, E9B, PSA, CBC ?Hyperlipidemia, hypertension, prediabetes, history of prostate cancer ?

## 2021-08-20 LAB — LIPID PANEL
Chol/HDL Ratio: 3 ratio (ref 0.0–5.0)
Cholesterol, Total: 180 mg/dL (ref 100–199)
HDL: 61 mg/dL (ref >39)
LDL Chol Calc (NIH): 106 mg/dL — ABNORMAL HIGH (ref 0–99)
Triglycerides: 69 mg/dL (ref 0–149)
VLDL Cholesterol Cal: 13 mg/dL (ref 5–40)

## 2021-08-20 LAB — BASIC METABOLIC PANEL
BUN/Creatinine Ratio: 9 — ABNORMAL LOW (ref 10–24)
BUN: 13 mg/dL (ref 8–27)
CO2: 25 mmol/L (ref 20–29)
Calcium: 8.9 mg/dL (ref 8.6–10.2)
Chloride: 104 mmol/L (ref 96–106)
Creatinine, Ser: 1.47 mg/dL — ABNORMAL HIGH (ref 0.76–1.27)
Glucose: 103 mg/dL — ABNORMAL HIGH (ref 70–99)
Potassium: 3.6 mmol/L (ref 3.5–5.2)
Sodium: 144 mmol/L (ref 134–144)
eGFR: 52 mL/min/{1.73_m2} — ABNORMAL LOW (ref >59)

## 2021-08-20 LAB — CBC WITH DIFFERENTIAL/PLATELET
Basophils Absolute: 0 10*3/uL (ref 0.0–0.2)
Basos: 1 %
EOS (ABSOLUTE): 0.1 10*3/uL (ref 0.0–0.4)
Eos: 3 %
Hematocrit: 40.2 % (ref 37.5–51.0)
Hemoglobin: 13.6 g/dL (ref 13.0–17.7)
Immature Grans (Abs): 0 10*3/uL (ref 0.0–0.1)
Immature Granulocytes: 0 %
Lymphocytes Absolute: 1.6 10*3/uL (ref 0.7–3.1)
Lymphs: 49 %
MCH: 30 pg (ref 26.6–33.0)
MCHC: 33.8 g/dL (ref 31.5–35.7)
MCV: 89 fL (ref 79–97)
Monocytes Absolute: 0.3 10*3/uL (ref 0.1–0.9)
Monocytes: 8 %
Neutrophils Absolute: 1.3 10*3/uL — ABNORMAL LOW (ref 1.4–7.0)
Neutrophils: 39 %
Platelets: 194 10*3/uL (ref 150–450)
RBC: 4.53 x10E6/uL (ref 4.14–5.80)
RDW: 14.6 % (ref 11.6–15.4)
WBC: 3.3 10*3/uL — ABNORMAL LOW (ref 3.4–10.8)

## 2021-08-20 LAB — HEPATIC FUNCTION PANEL
ALT: 19 IU/L (ref 0–44)
AST: 19 IU/L (ref 0–40)
Albumin: 4.2 g/dL (ref 3.8–4.8)
Alkaline Phosphatase: 132 IU/L — ABNORMAL HIGH (ref 44–121)
Bilirubin Total: 0.5 mg/dL (ref 0.0–1.2)
Bilirubin, Direct: 0.13 mg/dL (ref 0.00–0.40)
Total Protein: 6.5 g/dL (ref 6.0–8.5)

## 2021-08-20 LAB — HEMOGLOBIN A1C
Est. average glucose Bld gHb Est-mCnc: 128 mg/dL
Hgb A1c MFr Bld: 6.1 % — ABNORMAL HIGH (ref 4.8–5.6)

## 2021-08-20 LAB — PSA: Prostate Specific Ag, Serum: <0.1 ng/mL (ref 0.0–4.0)

## 2021-09-02 ENCOUNTER — Encounter: Payer: Self-pay | Admitting: Family Medicine

## 2021-09-18 ENCOUNTER — Encounter: Payer: Self-pay | Admitting: *Deleted

## 2021-09-26 ENCOUNTER — Ambulatory Visit: Payer: PRIVATE HEALTH INSURANCE | Admitting: Orthopedic Surgery

## 2021-09-30 ENCOUNTER — Ambulatory Visit: Payer: PRIVATE HEALTH INSURANCE

## 2021-09-30 ENCOUNTER — Ambulatory Visit (INDEPENDENT_AMBULATORY_CARE_PROVIDER_SITE_OTHER): Payer: PRIVATE HEALTH INSURANCE | Admitting: Orthopedic Surgery

## 2021-09-30 ENCOUNTER — Ambulatory Visit (INDEPENDENT_AMBULATORY_CARE_PROVIDER_SITE_OTHER): Payer: PRIVATE HEALTH INSURANCE

## 2021-09-30 DIAGNOSIS — M25512 Pain in left shoulder: Secondary | ICD-10-CM

## 2021-09-30 DIAGNOSIS — M67911 Unspecified disorder of synovium and tendon, right shoulder: Secondary | ICD-10-CM | POA: Diagnosis not present

## 2021-09-30 DIAGNOSIS — G8929 Other chronic pain: Secondary | ICD-10-CM

## 2021-09-30 DIAGNOSIS — M67912 Unspecified disorder of synovium and tendon, left shoulder: Secondary | ICD-10-CM | POA: Diagnosis not present

## 2021-09-30 DIAGNOSIS — M25511 Pain in right shoulder: Secondary | ICD-10-CM

## 2021-09-30 NOTE — Progress Notes (Signed)
Chief Complaint  Patient presents with   Follow-up    Bilateral shoulders right hurting more than left   Encounter Diagnoses  Name Primary?   Chronic left shoulder pain Yes   Chronic right shoulder pain    Rotator cuff dysfunction, right    Rotator cuff dysfunction, left    [name] [name] comes in for annual follow-up bilateral shoulders right hurting more than left some pain at night  His exam shows good strength in both shoulders normal external rotation with slightly less external rotation on the right than the left  Mild tenderness around the right shoulder primarily in the rotator interval  His strength in abduction and flexion still very good bilaterally  We took x-rays of both shoulders he has mild narrowing of the glenohumeral joint no evidence of spur or posterior glenoid erosion   Encounter Diagnoses  Name Primary?   Chronic left shoulder pain Yes   Chronic right shoulder pain    Rotator cuff dysfunction, right    Rotator cuff dysfunction, left      Procedure note the subacromial injection shoulder left   Verbal consent was obtained to inject the  Left   Shoulder  Timeout was completed to confirm the injection site is a subacromial space of the  left  shoulder  Medication used Depo-Medrol 40 mg and lidocaine 1% 3 cc  Anesthesia was provided by ethyl chloride  The injection was performed in the left  posterior subacromial space. After pinning the skin with alcohol and anesthetized the skin with ethyl chloride the subacromial space was injected using a 20-gauge needle. There were no complications  Sterile dressing was applied.     Procedure note the subacromial injection shoulder RIGHT    Verbal consent was obtained to inject the  RIGHT   Shoulder  Timeout was completed to confirm the injection site is a subacromial space of the  RIGHT  shoulder   Medication used Depo-Medrol 40 mg and lidocaine 1% 3 cc  Anesthesia was provided by ethyl chloride  The  injection was performed in the RIGHT  posterior subacromial space. After pinning the skin with alcohol and anesthetized the skin with ethyl chloride the subacromial space was injected using a 20-gauge needle. There were no complications  Sterile dressing was applied.  VOLTAREN CREAM   TYLENOL FOR PAIN   1 YEAR FU

## 2021-09-30 NOTE — Patient Instructions (Signed)

## 2021-10-01 ENCOUNTER — Encounter: Payer: Self-pay | Admitting: *Deleted

## 2021-10-01 NOTE — Patient Instructions (Signed)
Referring MD/PCP: Dr. Wolfgang Phoenix  Procedure: Colonoscopy  Has patient had this procedure before?  Dr. Oneida Alar, 10/17/16  If so, when, by whom and where?    Is there a family history of colon cancer?  no  Who?  What age when diagnosed?    Is patient diabetic? If yes, Type 1 or Type 2   no      Does patient have prosthetic heart valve or mechanical valve?  no  Do you have a pacemaker/defibrillator?  no  Has patient ever had endocarditis/atrial fibrillation? no  Does patient use oxygen? no  Has patient had joint replacement within last 12 months?  no  Is patient constipated or do they take laxatives? no  Does patient have a history of alcohol/drug use?  no  Have you had a stroke/heart attack last 6 mths? no  Do you take medicine for weight loss?  no   Is patient on blood thinner such as Coumadin, Plavix and/or Aspirin? no  Medications:  Current Outpatient Medications on File Prior to Visit  Medication Sig Dispense Refill   b complex vitamins capsule Take 1 capsule by mouth daily.     carvedilol (COREG) 25 MG tablet Take 1 tablet (25 mg total) by mouth 2 (two) times daily with a meal. 180 tablet 1   chlorpheniramine (CHLOR-TRIMETON) 4 MG tablet Take 4 mg by mouth 2 (two) times daily as needed for allergies.     doxazosin (CARDURA) 4 MG tablet TAKE 1 TABLET(4 MG) BY MOUTH AT BEDTIME 90 tablet 1   famotidine (PEPCID) 40 MG tablet Take 1 tablet (40 mg total) by mouth daily. 90 tablet 1   fluorometholone (FML) 0.1 % ophthalmic suspension 1 drop 3 (three) times daily.     indapamide (LOZOL) 1.25 MG tablet 1 qd 90 tablet 1   losartan (COZAAR) 100 MG tablet TAKE 1 TABLET(100 MG) BY MOUTH DAILY 90 tablet 1   Multiple Vitamin (MULTIVITAMIN) capsule Take 1 capsule by mouth daily.     NIFEdipine (PROCARDIA XL/NIFEDICAL-XL) 90 MG 24 hr tablet 1 qd 90 tablet 1   potassium chloride SA (KLOR-CON M) 20 MEQ tablet TAKE 3 TABS BY MOUTH EVERY MORNING AND 3 TABS BY MOUTH EVERY EVENING 540 tablet 1    rosuvastatin (CRESTOR) 20 MG tablet TAKE 1 TABLET(20 MG) BY MOUTH DAILY 90 tablet 1   tadalafil (CIALIS) 20 MG tablet Take 1 tablet (20 mg total) by mouth as needed for erectile dysfunction. (Patient not taking: Reported on 08/13/2021) 15 tablet 11   terbinafine (LAMISIL) 250 MG tablet Take 1 tablet (250 mg total) by mouth daily. 90 tablet 0   triamcinolone cream (KENALOG) 0.1 % Apply 1 application topically 2 (two) times daily. 30 g 0   No current facility-administered medications on file prior to visit.     Allergies: No Known Allergies

## 2021-10-11 ENCOUNTER — Other Ambulatory Visit: Payer: Self-pay | Admitting: Family Medicine

## 2021-10-13 ENCOUNTER — Other Ambulatory Visit: Payer: Self-pay | Admitting: Family Medicine

## 2021-10-18 NOTE — Progress Notes (Signed)
Colonoscopy June 2018, 3 adenomas removed.  Surveillance colonoscopy recommended in 2021.  Okay to schedule colonoscopy with Dr. Abbey Chatters.  ASA 2

## 2021-10-31 ENCOUNTER — Encounter (INDEPENDENT_AMBULATORY_CARE_PROVIDER_SITE_OTHER): Payer: Self-pay

## 2021-10-31 ENCOUNTER — Other Ambulatory Visit (INDEPENDENT_AMBULATORY_CARE_PROVIDER_SITE_OTHER): Payer: Self-pay

## 2021-10-31 ENCOUNTER — Telehealth (INDEPENDENT_AMBULATORY_CARE_PROVIDER_SITE_OTHER): Payer: Self-pay

## 2021-10-31 DIAGNOSIS — Z86010 Personal history of colonic polyps: Secondary | ICD-10-CM

## 2021-10-31 MED ORDER — PEG 3350-KCL-NA BICARB-NACL 420 G PO SOLR: 4000.0000 mL | ORAL | 0 refills | Status: DC

## 2021-10-31 NOTE — Telephone Encounter (Signed)
Thaer Miyoshi Ann Anthonyjames Bargar, CMA  ?

## 2021-11-04 ENCOUNTER — Encounter: Payer: Self-pay | Admitting: *Deleted

## 2021-11-05 ENCOUNTER — Ambulatory Visit (INDEPENDENT_AMBULATORY_CARE_PROVIDER_SITE_OTHER): Payer: PRIVATE HEALTH INSURANCE | Admitting: Podiatry

## 2021-11-05 DIAGNOSIS — M79674 Pain in right toe(s): Secondary | ICD-10-CM | POA: Diagnosis not present

## 2021-11-05 DIAGNOSIS — B351 Tinea unguium: Secondary | ICD-10-CM | POA: Diagnosis not present

## 2021-11-05 DIAGNOSIS — M79675 Pain in left toe(s): Secondary | ICD-10-CM | POA: Diagnosis not present

## 2021-11-05 MED ORDER — TERBINAFINE HCL 250 MG PO TABS: 250.0000 mg | ORAL_TABLET | Freq: Every day | ORAL | 0 refills | Status: AC

## 2021-11-05 NOTE — Progress Notes (Signed)
  Subjective:  Patient ID: [name] [name], male    DOB: 04-19-1956,  MRN: [id number]  Chief Complaint  Patient presents with   Nail Problem    4 months  follow up after nail fungus treatment    66 y.o. male presents with the above complaint. History confirmed with patient.  Skin and nails are doing much better Objective:  Physical Exam: warm, good capillary refill, no trophic changes or ulcerative lesions, normal DP and PT pulses, normal sensory exam, and tinea pedis.  Approximately 80 % clearance of onychomycosis noted in the photos below           Assessment:   1. Pain due to onychomycosis of toenails of both feet       Plan:  Patient was evaluated and treated and all questions answered.  Discussed the etiology and treatment options for the condition in detail with the patient. Educated patient on the topical and oral treatment options for mycotic nails and tinea pedis. Recommended debridement of the nails today. Sharp and mechanical debridement performed of all painful and mycotic nails today. Nails debrided in length and thickness using a nail nipper to level of comfort.  Recommended repeat treatment 90-day Lamisil course this was sent to his pharmacy.  I will reevaluate in 4 months.  Photographs taken.     Return in about 4 months (around 03/08/2022) for follow up after nail fungus treatment.

## 2021-12-03 ENCOUNTER — Encounter (HOSPITAL_COMMUNITY): Payer: Self-pay

## 2021-12-03 ENCOUNTER — Encounter (HOSPITAL_COMMUNITY)
Admission: RE | Disposition: A | Discharge status: Home / Self Care | Payer: Self-pay | Source: Ambulatory Visit | Attending: Internal Medicine

## 2021-12-03 ENCOUNTER — Ambulatory Visit (HOSPITAL_BASED_OUTPATIENT_CLINIC_OR_DEPARTMENT_OTHER): Payer: PRIVATE HEALTH INSURANCE | Admitting: Anesthesiology

## 2021-12-03 ENCOUNTER — Ambulatory Visit (HOSPITAL_COMMUNITY)
Admission: RE | Admit: 2021-12-03 | Discharge: 2021-12-03 | Disposition: A | Discharge status: Home / Self Care | Payer: PRIVATE HEALTH INSURANCE | Source: Ambulatory Visit | Attending: Internal Medicine | Admitting: Internal Medicine

## 2021-12-03 ENCOUNTER — Ambulatory Visit (HOSPITAL_COMMUNITY): Payer: PRIVATE HEALTH INSURANCE | Admitting: Anesthesiology

## 2021-12-03 ENCOUNTER — Other Ambulatory Visit: Payer: Self-pay

## 2021-12-03 ENCOUNTER — Encounter: Payer: Self-pay | Admitting: Family Medicine

## 2021-12-03 DIAGNOSIS — K648 Other hemorrhoids: Secondary | ICD-10-CM | POA: Diagnosis not present

## 2021-12-03 DIAGNOSIS — D123 Benign neoplasm of transverse colon: Secondary | ICD-10-CM | POA: Diagnosis not present

## 2021-12-03 DIAGNOSIS — K635 Polyp of colon: Secondary | ICD-10-CM

## 2021-12-03 DIAGNOSIS — Z8546 Personal history of malignant neoplasm of prostate: Secondary | ICD-10-CM | POA: Insufficient documentation

## 2021-12-03 DIAGNOSIS — K219 Gastro-esophageal reflux disease without esophagitis: Secondary | ICD-10-CM | POA: Diagnosis not present

## 2021-12-03 DIAGNOSIS — M1990 Unspecified osteoarthritis, unspecified site: Secondary | ICD-10-CM | POA: Diagnosis not present

## 2021-12-03 DIAGNOSIS — I10 Essential (primary) hypertension: Secondary | ICD-10-CM | POA: Insufficient documentation

## 2021-12-03 DIAGNOSIS — K5730 Diverticulosis of large intestine without perforation or abscess without bleeding: Secondary | ICD-10-CM

## 2021-12-03 DIAGNOSIS — Z1211 Encounter for screening for malignant neoplasm of colon: Secondary | ICD-10-CM | POA: Diagnosis not present

## 2021-12-03 DIAGNOSIS — Z86010 Personal history of colonic polyps: Secondary | ICD-10-CM

## 2021-12-03 DIAGNOSIS — J45909 Unspecified asthma, uncomplicated: Secondary | ICD-10-CM | POA: Insufficient documentation

## 2021-12-03 DIAGNOSIS — Z09 Encounter for follow-up examination after completed treatment for conditions other than malignant neoplasm: Secondary | ICD-10-CM | POA: Diagnosis not present

## 2021-12-03 DIAGNOSIS — N319 Neuromuscular dysfunction of bladder, unspecified: Secondary | ICD-10-CM | POA: Insufficient documentation

## 2021-12-03 DIAGNOSIS — Z79899 Other long term (current) drug therapy: Secondary | ICD-10-CM | POA: Diagnosis not present

## 2021-12-03 HISTORY — PX: POLYPECTOMY: SHX5525

## 2021-12-03 HISTORY — PX: COLONOSCOPY WITH PROPOFOL: SHX5780

## 2021-12-03 SURGERY — COLONOSCOPY WITH PROPOFOL
Anesthesia: General

## 2021-12-03 MED ORDER — PROPOFOL 500 MG/50ML IV EMUL
INTRAVENOUS | Status: DC | PRN
  Administered 2021-12-03: 200 ug/kg/min via INTRAVENOUS

## 2021-12-03 MED ORDER — LIDOCAINE 2% (20 MG/ML) 5 ML SYRINGE
INTRAMUSCULAR | Status: DC | PRN
  Administered 2021-12-03: 50 mg via INTRAVENOUS

## 2021-12-03 MED ORDER — PROPOFOL 10 MG/ML IV BOLUS
INTRAVENOUS | Status: DC | PRN
  Administered 2021-12-03: 100 mg via INTRAVENOUS

## 2021-12-03 MED ORDER — LACTATED RINGERS IV SOLN
INTRAVENOUS | Status: DC
Start: 2021-12-03 — End: 2021-12-03

## 2021-12-03 NOTE — Anesthesia Postprocedure Evaluation (Signed)
Anesthesia Post Note  Patient: [name] [name]  Procedure(s) Performed: COLONOSCOPY WITH PROPOFOL POLYPECTOMY  Patient location during evaluation: Phase II Anesthesia Type: General Level of consciousness: awake and alert and oriented Pain management: pain level controlled Vital Signs Assessment: post-procedure vital signs reviewed and stable Respiratory status: spontaneous breathing, nonlabored ventilation and respiratory function stable Cardiovascular status: blood pressure returned to baseline and stable Postop Assessment: no apparent nausea or vomiting Anesthetic complications: no   No notable events documented.   Last Vitals:  Vitals:   12/03/21 0746 12/03/21 0903  BP: (!) 168/93 109/79  Pulse: 67 65  Resp: (!) 21 20  Temp: 36.5 C 36.7 C  SpO2: 96% 94%    Last Pain:  Vitals:   12/03/21 0910  TempSrc:   PainSc: 0-No pain                 Riely Oetken C Rhilynn Preyer

## 2021-12-03 NOTE — Op Note (Signed)
[facility] Patient Name: [name] [name] Procedure Date: 12/03/2021 8:34 AM MRN: [id number] Date of Birth: 1955/09/27 Attending MD: Elon Alas. Abbey Chatters DO CSN: [id number] Age: 66 Admit Type: Outpatient Procedure:                Colonoscopy Indications:              Surveillance: Personal history of adenomatous                            polyps on last colonoscopy 5 years ago Providers:                Elon Alas. Abbey Chatters, DO, Caprice Kluver, Seaman                            Risa Grill, Technician, Bethel Born,                            [employer] officer Referring MD:              Medicines:                See the Anesthesia note for documentation of the                            administered medications Complications:            No immediate complications. Estimated Blood Loss:     Estimated blood loss was minimal. Procedure:                Pre-Anesthesia Assessment:                           - The anesthesia plan was to use monitored                            anesthesia care (MAC).                           After obtaining informed consent, the colonoscope                            was passed under direct vision. Throughout the                            procedure, the patient's blood pressure, pulse, and                            oxygen saturations were monitored continuously. The                            [id number]-HQ190L ([id number]) scope was introduced through                            the anus and advanced to the the cecum, identified                            by appendiceal orifice and ileocecal valve. The  colonoscopy was performed without difficulty. The                            patient tolerated the procedure well. The quality                            of the bowel preparation was evaluated using the                            BBPS [facility] Bowel Preparation Scale) with scores                            of: Right Colon = 3, Transverse  Colon = 3 and Left                            Colon = 3 (entire mucosa seen well with no residual                            staining, small fragments of stool or opaque                            liquid). The total BBPS score equals 9. Scope In: 8:47:13 AM Scope Out: 8:59:17 AM Scope Withdrawal Time: 0 hours 10 minutes 2 seconds  Total Procedure Duration: 0 hours 12 minutes 4 seconds  Findings:      The perianal and digital rectal examinations were normal.      Non-bleeding internal hemorrhoids were found during endoscopy.      Many small and large-mouthed diverticula were found in the sigmoid       colon, descending colon and transverse colon.      Two sessile polyps were found in the transverse colon. The polyps were 2       mm in size. These polyps were removed with a cold biopsy forceps.       Resection and retrieval were complete.      The exam was otherwise without abnormality. Impression:               - Non-bleeding internal hemorrhoids.                           - Diverticulosis in the sigmoid colon, in the                            descending colon and in the transverse colon.                           - Two 2 mm polyps in the transverse colon, removed                            with a cold biopsy forceps. Resected and retrieved.                           - The examination was otherwise normal. Moderate Sedation:      Per Anesthesia Care Recommendation:           -  Patient has a contact number available for                            emergencies. The signs and symptoms of potential                            delayed complications were discussed with the                            patient. Return to normal activities tomorrow.                            Written discharge instructions were provided to the                            patient.                           - Resume previous diet.                           - Continue present medications.                           -  Await pathology results.                           - Repeat colonoscopy in 5 years for surveillance.                           - Return to GI clinic PRN. Procedure Code(s):        --- Professional ---                           [phone], Colonoscopy, flexible; with biopsy, single                            or multiple Diagnosis Code(s):        --- Professional ---                           Z86.010, Personal history of colonic polyps                           K64.8, Other hemorrhoids                           K63.5, Polyp of colon                           K57.30, Diverticulosis of large intestine without                            perforation or abscess without bleeding CPT copyright 2019 American Medical Association. All rights reserved. The codes documented in this report are preliminary and upon coder review may  be revised to meet current compliance requirements. Elon Alas. Abbey Chatters, DO  Elon Alas. Abbey Chatters, DO 12/03/2021 9:02:27 AM This report has been signed electronically. Number of Addenda: 0

## 2021-12-03 NOTE — Discharge Instructions (Addendum)
  Colonoscopy Discharge Instructions  Read the instructions outlined below and refer to this sheet in the next few weeks. These discharge instructions provide you with general information on caring for yourself after you leave the hospital. Your doctor may also give you specific instructions. While your treatment has been planned according to the most current medical practices available, unavoidable complications occasionally occur.   ACTIVITY You may resume your regular activity, but move at a slower pace for the next 24 hours.  Take frequent rest periods for the next 24 hours.  Walking will help get rid of the air and reduce the bloated feeling in your belly (abdomen).  No driving for 24 hours (because of the medicine (anesthesia) used during the test).   Do not sign any important legal documents or operate any machinery for 24 hours (because of the anesthesia used during the test).  NUTRITION Drink plenty of fluids.  You may resume your normal diet as instructed by your doctor.  Begin with a light meal and progress to your normal diet. Heavy or fried foods are harder to digest and may make you feel sick to your stomach (nauseated).  Avoid alcoholic beverages for 24 hours or as instructed.  MEDICATIONS You may resume your normal medications unless your doctor tells you otherwise.  WHAT YOU CAN EXPECT TODAY Some feelings of bloating in the abdomen.  Passage of more gas than usual.  Spotting of blood in your stool or on the toilet paper.  IF YOU HAD POLYPS REMOVED DURING THE COLONOSCOPY: No aspirin products for 7 days or as instructed.  No alcohol for 7 days or as instructed.  Eat a soft diet for the next 24 hours.  FINDING OUT THE RESULTS OF YOUR TEST Not all test results are available during your visit. If your test results are not back during the visit, make an appointment with your caregiver to find out the results. Do not assume everything is normal if you have not heard from your  caregiver or the medical facility. It is important for you to follow up on all of your test results.  SEEK IMMEDIATE MEDICAL ATTENTION IF: You have more than a spotting of blood in your stool.  Your belly is swollen (abdominal distention).  You are nauseated or vomiting.  You have a temperature over 101.  You have abdominal pain or discomfort that is severe or gets worse throughout the day.   Your colonoscopy revealed 2 polyp(s) which I removed successfully. Await pathology results, my office will contact you. I recommend repeating colonoscopy in 5 years for surveillance purposes.   You also have diverticulosis and internal hemorrhoids. I would recommend increasing fiber in your diet or adding OTC Benefiber/Metamucil. Be sure to drink at least 4 to 6 glasses of water daily. Follow-up with GI as needed.   I hope you have a great rest of your week!  Charles K. Carver, D.O. Gastroenterology and Hepatology [facility]  

## 2021-12-03 NOTE — Transfer of Care (Signed)
Immediate Anesthesia Transfer of Care Note  Patient: [name] [name]  Procedure(s) Performed: COLONOSCOPY WITH PROPOFOL POLYPECTOMY  Patient Location: Endoscopy Unit  Anesthesia Type:MAC  Level of Consciousness: sedated and patient cooperative  Airway & Oxygen Therapy: Patient Spontanous Breathing and Patient connected to nasal cannula oxygen  Post-op Assessment: Report given to RN and Post -op Vital signs reviewed and stable  Post vital signs: Reviewed and stable  Last Vitals:  Vitals Value Taken Time  BP    Temp    Pulse    Resp    SpO2      Last Pain:  Vitals:   12/03/21 0842  TempSrc:   PainSc: 0-No pain         Complications: No notable events documented.

## 2021-12-03 NOTE — Anesthesia Preprocedure Evaluation (Signed)
Anesthesia Evaluation  Patient identified by MRN, date of birth, ID band Patient awake    Reviewed: Allergy & Precautions, NPO status , Patient's Chart, lab work & pertinent test results, reviewed documented beta blocker date and time   History of Anesthesia Complications Negative for: history of anesthetic complications  Airway Mallampati: III  TM Distance: >3 FB Neck ROM: Full    Dental  (+) Dental Advisory Given, Caps   Pulmonary asthma ,    Pulmonary exam normal breath sounds clear to auscultation       Cardiovascular Exercise Tolerance: Good hypertension, Pt. on home beta blockers and Pt. on medications Normal cardiovascular exam Rhythm:Regular Rate:Normal     Neuro/Psych  Neuromuscular disease negative psych ROS   GI/Hepatic Neg liver ROS, GERD  Medicated and Controlled,  Endo/Other  negative endocrine ROS  Renal/GU Renal InsufficiencyRenal disease Bladder dysfunction (prostate cancer)      Musculoskeletal  (+) Arthritis , Osteoarthritis,    Abdominal   Peds negative pediatric ROS (+)  Hematology  (+) Blood dyscrasia, anemia ,   Anesthesia Other Findings   Reproductive/Obstetrics negative OB ROS                             Anesthesia Physical Anesthesia Plan  ASA: 2  Anesthesia Plan: General   Post-op Pain Management: Minimal or no pain anticipated   Induction:   PONV Risk Score and Plan: Propofol infusion  Airway Management Planned: Nasal Cannula and Natural Airway  Additional Equipment:   Intra-op Plan:   Post-operative Plan:   Informed Consent: I have reviewed the patients History and Physical, chart, labs and discussed the procedure including the risks, benefits and alternatives for the proposed anesthesia with the patient or authorized representative who has indicated his/her understanding and acceptance.     Dental advisory given  Plan Discussed with:  CRNA and Surgeon  Anesthesia Plan Comments:         Anesthesia Quick Evaluation

## 2021-12-03 NOTE — H&P (Signed)
Primary Care Physician:  Kathyrn Drown, MD Primary Gastroenterologist:  Dr. Abbey Chatters  Pre-Procedure History & Physical: HPI:  [name] [name] is a 66 y.o. male is here for a colonoscopy to be performed for surveillance purposes, personal history of adenomatous colon polyps 2018.   Past Medical History:  Diagnosis Date   Adenomatous colon polyp 2007 [place]   DDD (degenerative disc disease)    GERD (gastroesophageal reflux disease)    Herniated disc    Back   HTN (hypertension)    Hyperlipemia    Prostate CA ([place]) 2004    Past Surgical History:  Procedure Laterality Date   COLONOSCOPY  11/15/2010   Dr. Fields:Pedunculated polyp/pan-colonic  TICS/mod IH. tubular adenoma   COLONOSCOPY  2007   Dr. Oneida Alar: 3 mm tubular adeoma   COLONOSCOPY N/A 03/08/2014   Procedure: COLONOSCOPY;  Surgeon: Danie Binder, MD;  Location: [facility];  Service: Endoscopy;  Laterality: N/A;  100 - moved to 12:45 - Ginger to notify pt   COLONOSCOPY N/A 10/17/2016   Procedure: COLONOSCOPY;  Surgeon: Danie Binder, MD;  Location: [facility];  Service: Endoscopy;  Laterality: N/A;  9:00am   KNEE ARTHROSCOPY W/ MENISCECTOMY  2011 RIGHT   LAMINECTOMY AND MICRODISCECTOMY LUMBAR SPINE  2002   PROSTATECTOMY      Prior to Admission medications   Medication Sig Start Date End Date Taking? Authorizing Provider  acetaminophen (TYLENOL) 650 MG CR tablet Take 1,300 mg by mouth every 8 (eight) hours as needed for pain.   Yes [provider]  carvedilol (COREG) 25 MG tablet Take 1 tablet (25 mg total) by mouth 2 (two) times daily with a meal. 08/13/21  Yes Luking, Elayne Snare, MD  chlorpheniramine (CHLOR-TRIMETON) 4 MG tablet Take 4 mg by mouth 2 (two) times daily as needed for allergies.   Yes [provider]  diclofenac Sodium (VOLTAREN) 1 % GEL Apply 1 Application topically 2 (two) times daily as needed (pain).   Yes [provider]  doxazosin (CARDURA) 4 MG tablet TAKE 1 TABLET(4 MG) BY MOUTH  AT BEDTIME 08/13/21  Yes Luking, Scott A, MD  famotidine (PEPCID) 40 MG tablet Take 1 tablet (40 mg total) by mouth daily. Patient taking differently: Take 40 mg by mouth at bedtime. 08/13/21  Yes Luking, Elayne Snare, MD  fluticasone (FLONASE) 50 MCG/ACT nasal spray Place 1 spray into both nostrils daily as needed for allergies or rhinitis.   Yes [provider]  indapamide (LOZOL) 1.25 MG tablet TAKE 1 TABLET(1.25 MG) BY MOUTH DAILY 10/14/21  Yes Luking, Scott A, MD  L-ARGININE PO Take 3 capsules by mouth daily.   Yes [provider]  losartan (COZAAR) 100 MG tablet TAKE 1 TABLET(100 MG) BY MOUTH DAILY 08/13/21  Yes Luking, Scott A, MD  NIFEdipine (PROCARDIA XL/NIFEDICAL-XL) 90 MG 24 hr tablet TAKE 1 TABLET(90 MG) BY MOUTH DAILY 10/11/21  Yes Luking, Scott A, MD  potassium chloride SA (KLOR-CON M) 20 MEQ tablet TAKE 3 TABS BY MOUTH EVERY MORNING AND 3 TABS BY MOUTH EVERY EVENING 08/13/21  Yes Luking, Scott A, MD  rosuvastatin (CRESTOR) 20 MG tablet TAKE 1 TABLET(20 MG) BY MOUTH DAILY 08/13/21  Yes Luking, Scott A, MD  terbinafine (LAMISIL) 250 MG tablet Take 1 tablet (250 mg total) by mouth daily. 11/05/21 02/03/22 Yes McDonald, Stephan Minister, DPM  polyethylene glycol-electrolytes (TRILYTE) 420 g solution Take 4,000 mLs by mouth as directed. 10/31/21   Eloise Harman, DO  triamcinolone cream (KENALOG)  0.1 % Apply 1 application topically 2 (two) times daily. Patient not taking: Reported on 11/27/2021 06/06/21   [employer], Trenton Gammon, NP    Allergies as of 10/31/2021   (No Known Allergies)    Family History  Problem Relation Age of Onset   Heart Problems Mother    Aneurysm Brother    Colon cancer Neg Hx    Colon polyps Neg Hx    Neuropathy Neg Hx     Social History   Socioeconomic History   Marital status: Married    Spouse name: Not on file   Number of children: Not on file   Years of education: Not on file   Highest education level: Not on file  Occupational History    Employer:  [employer]    Comment: law-enforcement  Tobacco Use   Smoking status: Never   Smokeless tobacco: Never  Vaping Use   Vaping Use: Never used  Substance and Sexual Activity   Alcohol use: No   Drug use: No   Sexual activity: Not on file  Other Topics Concern   Not on file  Social History Narrative   Lives at home w/ his wife   Caffeine: 1 cup per day   Social Determinants of Health   Financial Resource Strain: Not on file  Food Insecurity: Not on file  Transportation Needs: Not on file  Physical Activity: Not on file  Stress: Not on file  Social Connections: Not on file  Intimate Partner Violence: Not on file    Review of Systems: See HPI, otherwise negative ROS  Physical Exam: Vital signs in last 24 hours: Temp:  [97.7 F (36.5 C)] 97.7 F (36.5 C) (08/15 0746) Pulse Rate:  [67] 67 (08/15 0746) Resp:  [21] 21 (08/15 0746) BP: (168)/(93) 168/93 (08/15 0746) SpO2:  [96 %] 96 % (08/15 0746) Weight:  [111.1 kg] 111.1 kg (08/15 0746)   General:   Alert,  Well-developed, well-nourished, pleasant and cooperative in NAD Head:  Normocephalic and atraumatic. Eyes:  Sclera clear, no icterus.   Conjunctiva pink. Ears:  Normal auditory acuity. Nose:  No deformity, discharge,  or lesions. Mouth:  No deformity or lesions, dentition normal. Neck:  Supple; no masses or thyromegaly. Lungs:  Clear throughout to auscultation.   No wheezes, crackles, or rhonchi. No acute distress. Heart:  Regular rate and rhythm; no murmurs, clicks, rubs,  or gallops. Abdomen:  Soft, nontender and nondistended. No masses, hepatosplenomegaly or hernias noted. Normal bowel sounds, without guarding, and without rebound.   Msk:  Symmetrical without gross deformities. Normal posture. Extremities:  Without clubbing or edema. Neurologic:  Alert and  oriented x4;  grossly normal neurologically. Skin:  Intact without significant lesions or rashes. Cervical Nodes:  No significant cervical  adenopathy. Psych:  Alert and cooperative. Normal mood and affect.  Impression/Plan: [name] [name] is here for a colonoscopy to be performed for surveillance purposes, personal history of adenomatous colon polyps 2018.   The risks of the procedure including infection, bleed, or perforation as well as benefits, limitations, alternatives and imponderables have been reviewed with the patient. Questions have been answered. All parties agreeable.

## 2021-12-05 LAB — SURGICAL PATHOLOGY

## 2021-12-06 ENCOUNTER — Encounter (HOSPITAL_COMMUNITY): Payer: Self-pay | Admitting: Internal Medicine

## 2022-01-22 ENCOUNTER — Encounter: Payer: Self-pay | Admitting: Family Medicine

## 2022-01-22 ENCOUNTER — Telehealth: Payer: Self-pay | Admitting: Family Medicine

## 2022-01-22 NOTE — Telephone Encounter (Signed)
Patient has physical on 02/19/22 and needing labs done

## 2022-01-22 NOTE — Telephone Encounter (Signed)
Last labs completed 08/19/21-CBC, PSA, A1C, BMET, HEPATIC, LIPID. Please advise. Thank you!

## 2022-01-22 NOTE — Telephone Encounter (Signed)
Metabolic 7, lipid, liver, A1c, urine ACR Prediabetes, hyperlipidemia, hypertension

## 2022-01-23 ENCOUNTER — Other Ambulatory Visit: Payer: Self-pay

## 2022-01-23 DIAGNOSIS — R7303 Prediabetes: Secondary | ICD-10-CM

## 2022-01-23 DIAGNOSIS — I10 Essential (primary) hypertension: Secondary | ICD-10-CM

## 2022-01-23 DIAGNOSIS — N289 Disorder of kidney and ureter, unspecified: Secondary | ICD-10-CM

## 2022-01-23 DIAGNOSIS — E782 Mixed hyperlipidemia: Secondary | ICD-10-CM

## 2022-01-23 NOTE — Telephone Encounter (Signed)
Labs ordered, message sent to pt.

## 2022-02-08 LAB — HEPATIC FUNCTION PANEL
ALT: 16 IU/L (ref 0–44)
AST: 18 IU/L (ref 0–40)
Albumin: 4.3 g/dL (ref 3.9–4.9)
Alkaline Phosphatase: 133 IU/L — ABNORMAL HIGH (ref 44–121)
Bilirubin Total: 0.6 mg/dL (ref 0.0–1.2)
Bilirubin, Direct: 0.16 mg/dL (ref 0.00–0.40)
Total Protein: 6.4 g/dL (ref 6.0–8.5)

## 2022-02-08 LAB — BASIC METABOLIC PANEL
BUN/Creatinine Ratio: 10 (ref 10–24)
BUN: 15 mg/dL (ref 8–27)
CO2: 30 mmol/L — ABNORMAL HIGH (ref 20–29)
Calcium: 8.9 mg/dL (ref 8.6–10.2)
Chloride: 104 mmol/L (ref 96–106)
Creatinine, Ser: 1.48 mg/dL — ABNORMAL HIGH (ref 0.76–1.27)
Glucose: 103 mg/dL — ABNORMAL HIGH (ref 70–99)
Potassium: 3.8 mmol/L (ref 3.5–5.2)
Sodium: 144 mmol/L (ref 134–144)
eGFR: 52 mL/min/{1.73_m2} — ABNORMAL LOW (ref >59)

## 2022-02-08 LAB — LIPID PANEL
Chol/HDL Ratio: 2.6 ratio (ref 0.0–5.0)
Cholesterol, Total: 162 mg/dL (ref 100–199)
HDL: 62 mg/dL (ref >39)
LDL Chol Calc (NIH): 88 mg/dL (ref 0–99)
Triglycerides: 59 mg/dL (ref 0–149)
VLDL Cholesterol Cal: 12 mg/dL (ref 5–40)

## 2022-02-08 LAB — MICROALBUMIN / CREATININE URINE RATIO
Creatinine, Urine: 120.9 mg/dL
Microalb/Creat Ratio: <2 mg/g creat (ref 0–29)
Microalbumin, Urine: <3 ug/mL

## 2022-02-08 LAB — HEMOGLOBIN A1C
Est. average glucose Bld gHb Est-mCnc: 128 mg/dL
Hgb A1c MFr Bld: 6.1 % — ABNORMAL HIGH (ref 4.8–5.6)

## 2022-02-14 ENCOUNTER — Other Ambulatory Visit: Payer: Self-pay | Admitting: Family Medicine

## 2022-02-19 ENCOUNTER — Ambulatory Visit (INDEPENDENT_AMBULATORY_CARE_PROVIDER_SITE_OTHER): Payer: PRIVATE HEALTH INSURANCE | Admitting: Family Medicine

## 2022-02-19 VITALS — BP 128/80 | HR 70 | Temp 97.8°F | Ht 75.0 in | Wt 250.0 lb

## 2022-02-19 DIAGNOSIS — E782 Mixed hyperlipidemia: Secondary | ICD-10-CM | POA: Diagnosis not present

## 2022-02-19 DIAGNOSIS — Z0000 Encounter for general adult medical examination without abnormal findings: Secondary | ICD-10-CM

## 2022-02-19 DIAGNOSIS — Z8639 Personal history of other endocrine, nutritional and metabolic disease: Secondary | ICD-10-CM | POA: Diagnosis not present

## 2022-02-19 DIAGNOSIS — N1831 Chronic kidney disease, stage 3a: Secondary | ICD-10-CM | POA: Diagnosis not present

## 2022-02-19 MED ORDER — LOSARTAN POTASSIUM 100 MG PO TABS: ORAL_TABLET | ORAL | 1 refills | Status: DC

## 2022-02-19 MED ORDER — NIFEDIPINE ER OSMOTIC RELEASE 90 MG PO TB24
ORAL_TABLET | ORAL | 1 refills | Status: DC
Start: 2022-02-19 — End: 2022-08-20

## 2022-02-19 MED ORDER — DOXAZOSIN MESYLATE 4 MG PO TABS: ORAL_TABLET | ORAL | 1 refills | Status: DC

## 2022-02-19 MED ORDER — INDAPAMIDE 1.25 MG PO TABS: ORAL_TABLET | ORAL | 1 refills | Status: DC

## 2022-02-19 MED ORDER — ROSUVASTATIN CALCIUM 20 MG PO TABS: ORAL_TABLET | ORAL | 1 refills | Status: DC

## 2022-02-19 MED ORDER — POTASSIUM CHLORIDE CRYS ER 20 MEQ PO TBCR: EXTENDED_RELEASE_TABLET | ORAL | 1 refills | Status: DC

## 2022-02-19 NOTE — Patient Instructions (Signed)
Results for orders placed or performed in visit on 20/94/70  Basic metabolic panel  Result Value Ref Range   Glucose 103 (H) 70 - 99 mg/dL   BUN 15 8 - 27 mg/dL   Creatinine, Ser 1.48 (H) 0.76 - 1.27 mg/dL   eGFR 52 (L) >59 mL/min/1.73   BUN/Creatinine Ratio 10 10 - 24   Sodium 144 134 - 144 mmol/L   Potassium 3.8 3.5 - 5.2 mmol/L   Chloride 104 96 - 106 mmol/L   CO2 30 (H) 20 - 29 mmol/L   Calcium 8.9 8.6 - 10.2 mg/dL  Lipid panel  Result Value Ref Range   Cholesterol, Total 162 100 - 199 mg/dL   Triglycerides 59 0 - 149 mg/dL   HDL 62 >39 mg/dL   VLDL Cholesterol Cal 12 5 - 40 mg/dL   LDL Chol Calc (NIH) 88 0 - 99 mg/dL   Chol/HDL Ratio 2.6 0.0 - 5.0 ratio  Hepatic Function Panel  Result Value Ref Range   Total Protein 6.4 6.0 - 8.5 g/dL   Albumin 4.3 3.9 - 4.9 g/dL   Bilirubin Total 0.6 0.0 - 1.2 mg/dL   Bilirubin, Direct 0.16 0.00 - 0.40 mg/dL   Alkaline Phosphatase 133 (H) 44 - 121 IU/L   AST 18 0 - 40 IU/L   ALT 16 0 - 44 IU/L  Hemoglobin A1c  Result Value Ref Range   Hgb A1c MFr Bld 6.1 (H) 4.8 - 5.6 %   Est. average glucose Bld gHb Est-mCnc 128 mg/dL  Microalbumin/Creatinine Ratio, Urine  Result Value Ref Range   Creatinine, Urine 120.9 Not Estab. mg/dL   Microalbumin, Urine <3.0 Not Estab. ug/mL   Microalb/Creat Ratio <2 0 - 29 mg/g creat

## 2022-02-19 NOTE — Progress Notes (Signed)
Subjective:    Patient ID: [name] [name], male    DOB: [birth year], 66 y.o.   MRN: [id number]  HPI AWV- Annual Wellness Visit  The patient was seen for their annual wellness visit. The patient's past medical history, surgical history, and family history were reviewed. Pertinent vaccines were reviewed ( tetanus, pneumonia, shingles, flu) The patient's medication list was reviewed and updated.  The height and weight were entered.  BMI recorded in electronic record elsewhere  Cognitive screening was completed. Outcome of Mini - Cog: 5   Falls /depression screening electronically recorded within record elsewhere  Current tobacco usage:none (All patients who use tobacco were given written and verbal information on quitting)  Recent listing of emergency department/hospitalizations over the past year were reviewed.  current specialist the patient sees on a regular basis: podiatry, orthopedics   Medicare annual wellness visit patient questionnaire was reviewed.  A written screening schedule for the patient for the next 5-10 years was given. Appropriate discussion of followup regarding next visit was discussed.    Results for orders placed or performed in visit on 06/01/13  Basic metabolic panel  Result Value Ref Range   Glucose 103 (H) 70 - 99 mg/dL   BUN 15 8 - 27 mg/dL   Creatinine, Ser 1.48 (H) 0.76 - 1.27 mg/dL   eGFR 52 (L) >59 mL/min/1.73   BUN/Creatinine Ratio 10 10 - 24   Sodium 144 134 - 144 mmol/L   Potassium 3.8 3.5 - 5.2 mmol/L   Chloride 104 96 - 106 mmol/L   CO2 30 (H) 20 - 29 mmol/L   Calcium 8.9 8.6 - 10.2 mg/dL  Lipid panel  Result Value Ref Range   Cholesterol, Total 162 100 - 199 mg/dL   Triglycerides 59 0 - 149 mg/dL   HDL 62 >39 mg/dL   VLDL Cholesterol Cal 12 5 - 40 mg/dL   LDL Chol Calc (NIH) 88 0 - 99 mg/dL   Chol/HDL Ratio 2.6 0.0 - 5.0 ratio  Hepatic Function Panel  Result Value Ref Range   Total Protein 6.4 6.0 - 8.5 g/dL   Albumin 4.3  3.9 - 4.9 g/dL   Bilirubin Total 0.6 0.0 - 1.2 mg/dL   Bilirubin, Direct 0.16 0.00 - 0.40 mg/dL   Alkaline Phosphatase 133 (H) 44 - 121 IU/L   AST 18 0 - 40 IU/L   ALT 16 0 - 44 IU/L  Hemoglobin A1c  Result Value Ref Range   Hgb A1c MFr Bld 6.1 (H) 4.8 - 5.6 %   Est. average glucose Bld gHb Est-mCnc 128 mg/dL  Microalbumin/Creatinine Ratio, Urine  Result Value Ref Range   Creatinine, Urine 120.9 Not Estab. mg/dL   Microalbumin, Urine <3.0 Not Estab. ug/mL   Microalb/Creat Ratio <2 0 - 29 mg/g creat      Review of Systems     Objective:   Physical Exam  Lungs are clear hearts regular pulses normal extremities no edema skin warm dry      Assessment & Plan:   1. Encounter for annual wellness visit (AWV) in Medicare patient Adult wellness-complete.wellness physical was conducted today. Importance of diet and exercise were discussed in detail.  Importance of stress reduction and healthy living were discussed.  In addition to this a discussion regarding safety was also covered.  We also reviewed over immunizations and gave recommendations regarding current immunization needed for age.   In addition to this additional areas were also touched on including: Preventative health exams needed:  Colonoscopy next colonoscopy 4 years from now  Patient was advised yearly wellness exam   2. Hx of hypokalemia Continue potassium.  Recent lab work looked good - potassium chloride SA (KLOR-CON M) 20 MEQ tablet; TAKE 3 TABS BY MOUTH EVERY MORNING AND 3 TABS BY MOUTH EVERY EVENING  Dispense: 540 tablet; Refill: 1  3. Mixed hyperlipidemia Reese lab work looked good - rosuvastatin (CRESTOR) 20 MG tablet; TAKE 1 TABLET(20 MG) BY MOUTH DAILY  Dispense: 90 tablet; Refill: 1

## 2022-03-03 ENCOUNTER — Encounter: Payer: Self-pay | Admitting: Family Medicine

## 2022-03-10 ENCOUNTER — Ambulatory Visit (INDEPENDENT_AMBULATORY_CARE_PROVIDER_SITE_OTHER): Payer: PRIVATE HEALTH INSURANCE | Admitting: Podiatry

## 2022-03-10 DIAGNOSIS — B351 Tinea unguium: Secondary | ICD-10-CM | POA: Diagnosis not present

## 2022-03-10 DIAGNOSIS — M79675 Pain in left toe(s): Secondary | ICD-10-CM | POA: Diagnosis not present

## 2022-03-10 DIAGNOSIS — M79674 Pain in right toe(s): Secondary | ICD-10-CM | POA: Diagnosis not present

## 2022-03-10 MED ORDER — TERBINAFINE HCL 250 MG PO TABS: 250.0000 mg | ORAL_TABLET | Freq: Every day | ORAL | 0 refills | Status: AC

## 2022-03-10 NOTE — Progress Notes (Signed)
  Subjective:  Patient ID: [name] [name], male    DOB: 09/27/1955,  MRN: [id number]  Chief Complaint  Patient presents with   Nail Problem    66 y.o. male presents with the above complaint. History confirmed with patient.  Still has some nail disc duration but overall doing much better  Objective:  Physical Exam: warm, good capillary refill, no trophic changes or ulcerative lesions, normal DP and PT pulses, normal sensory exam,.  Approximately 90% cleared           Assessment:   1. Pain due to onychomycosis of toenails of both feet       Plan:  Patient was evaluated and treated and all questions answered.  Discussed the etiology and treatment options for the condition in detail with the patient. Educated patient on the topical and oral treatment options for mycotic nails and tinea pedis. Recommended debridement of the nails today. Sharp and mechanical debridement performed of all painful and mycotic nails today. Nails debrided in length and thickness using a nail nipper to level of comfort.  Recommended repeat treatment 90-day Lamisil course this was sent to his pharmacy.  I will reevaluate in 4 months.  Photographs taken.     No follow-ups on file.

## 2022-05-12 ENCOUNTER — Encounter: Payer: Self-pay | Admitting: Family Medicine

## 2022-06-19 ENCOUNTER — Encounter: Payer: Self-pay | Admitting: Radiology

## 2022-08-20 ENCOUNTER — Encounter: Payer: Self-pay | Admitting: Family Medicine

## 2022-08-20 ENCOUNTER — Ambulatory Visit: Payer: PRIVATE HEALTH INSURANCE | Admitting: Family Medicine

## 2022-08-20 VITALS — BP 122/78 | HR 59 | Ht 75.0 in | Wt 248.0 lb

## 2022-08-20 DIAGNOSIS — J301 Allergic rhinitis due to pollen: Secondary | ICD-10-CM

## 2022-08-20 DIAGNOSIS — Z8639 Personal history of other endocrine, nutritional and metabolic disease: Secondary | ICD-10-CM

## 2022-08-20 DIAGNOSIS — E782 Mixed hyperlipidemia: Secondary | ICD-10-CM | POA: Diagnosis not present

## 2022-08-20 DIAGNOSIS — I10 Essential (primary) hypertension: Secondary | ICD-10-CM | POA: Diagnosis not present

## 2022-08-20 MED ORDER — DOXAZOSIN MESYLATE 4 MG PO TABS: ORAL_TABLET | ORAL | 1 refills | Status: DC

## 2022-08-20 MED ORDER — TADALAFIL 5 MG PO TABS: 5.0000 mg | ORAL_TABLET | Freq: Every day | ORAL | 11 refills | Status: DC

## 2022-08-20 MED ORDER — POTASSIUM CHLORIDE CRYS ER 20 MEQ PO TBCR: EXTENDED_RELEASE_TABLET | ORAL | 1 refills | Status: DC

## 2022-08-20 MED ORDER — INDAPAMIDE 1.25 MG PO TABS: ORAL_TABLET | ORAL | 1 refills | Status: DC

## 2022-08-20 MED ORDER — NIFEDIPINE ER OSMOTIC RELEASE 90 MG PO TB24: ORAL_TABLET | ORAL | 1 refills | Status: DC

## 2022-08-20 MED ORDER — LOSARTAN POTASSIUM 100 MG PO TABS
ORAL_TABLET | ORAL | 1 refills | Status: DC
Start: 2022-08-20 — End: 2022-10-10

## 2022-08-20 MED ORDER — CARVEDILOL 25 MG PO TABS
ORAL_TABLET | ORAL | 1 refills | Status: DC
Start: 2022-08-20 — End: 2023-02-20

## 2022-08-20 MED ORDER — ROSUVASTATIN CALCIUM 20 MG PO TABS: ORAL_TABLET | ORAL | 1 refills | Status: DC

## 2022-08-20 NOTE — Progress Notes (Signed)
   Subjective:    Patient ID: [name] [name], male    DOB: December 08, 1955, 67 y.o.   MRN: [id number]  HPI Patient arrives today for 6 month follow up.  Patient here today for follow-up Previous labs through the [facility] work in the fall stable A1c prediabetes Patient states he is trying to eat healthy and regular physical activity  Patient would like to discuss allergies.  He is wondering which OTC medicines might work the past in combination   Review of Systems     Objective:   Physical Exam  General-in no acute distress Eyes-no discharge Lungs-respiratory rate normal, CTA CV-no murmurs,RRR Extremities skin warm dry no edema Neuro grossly normal Behavior normal, alert       Assessment & Plan:  Moderate allergies recommend Zyrtec generic Continue Flonase as needed OTC Opcon-A as needed for allergy eyedrops  Blood pressure excellent control continue medication regimen continue healthy diet  Follow-up 6 months  Patient's weight is up but overall he is in very good condition in regards to strength and cardiovascular fitness  Patient does have previous surgery for prostate cancer and has leakage because of this he has tried Cialis 5 mg daily before for urinary frequency and this has helped we will go ahead and prescribe this

## 2022-08-23 ENCOUNTER — Other Ambulatory Visit: Payer: Self-pay | Admitting: Family Medicine

## 2022-08-25 ENCOUNTER — Telehealth: Payer: Self-pay

## 2022-08-25 NOTE — Telephone Encounter (Signed)
Received via fax Rx request: Prescription sent electronically to pharmacy (08/20/22) 

## 2022-08-25 NOTE — Telephone Encounter (Signed)
Prescription Request  08/25/2022  LOV: Visit date not found  What is the name of the medication or equipment?   potassium chloride SA (KLOR-CON M) 20 MEQ tablet    Have you contacted your pharmacy to request a refill? Yes   Which pharmacy would you like this sent to?  [id number] - [place], [place] - [address] [place] [address] Phone: [phone] Fax: [phone]    Patient notified that their request is being sent to the clinical staff for review and that they should receive a response within 2 business days.   Please advise at Mobile [phone] (mobile)

## 2022-09-30 ENCOUNTER — Encounter: Payer: PRIVATE HEALTH INSURANCE | Admitting: Urology

## 2022-09-30 DIAGNOSIS — N1831 Chronic kidney disease, stage 3a: Secondary | ICD-10-CM | POA: Insufficient documentation

## 2022-10-02 ENCOUNTER — Ambulatory Visit: Payer: PRIVATE HEALTH INSURANCE | Admitting: Orthopedic Surgery

## 2022-10-02 ENCOUNTER — Encounter: Payer: Self-pay | Admitting: Orthopedic Surgery

## 2022-10-02 VITALS — Ht 75.0 in | Wt 255.0 lb

## 2022-10-02 DIAGNOSIS — M67911 Unspecified disorder of synovium and tendon, right shoulder: Secondary | ICD-10-CM

## 2022-10-02 DIAGNOSIS — G8929 Other chronic pain: Secondary | ICD-10-CM

## 2022-10-02 DIAGNOSIS — M67912 Unspecified disorder of synovium and tendon, left shoulder: Secondary | ICD-10-CM | POA: Diagnosis not present

## 2022-10-02 NOTE — Progress Notes (Signed)
Chief Complaint  Patient presents with   Shoulder Pain    Bilateral / feels like he can live with the pain he is having     Encounter Diagnoses  Name Primary?   Chronic left shoulder pain Yes   Chronic right shoulder pain    Rotator cuff dysfunction, right    Rotator cuff dysfunction, left     [name] [name] is doing well he has good strength in his shoulder good range of motion in both shoulders as well.  He is having minimal discomfort  He was having some pain about 2 weeks ago but went to the chiropractor and got some treatment and then loosened up  Follow-up in a year for x-rays

## 2022-10-10 ENCOUNTER — Other Ambulatory Visit: Payer: Self-pay | Admitting: Nurse Practitioner

## 2022-10-10 ENCOUNTER — Telehealth: Payer: Self-pay

## 2022-10-10 DIAGNOSIS — E782 Mixed hyperlipidemia: Secondary | ICD-10-CM

## 2022-10-10 DIAGNOSIS — Z8639 Personal history of other endocrine, nutritional and metabolic disease: Secondary | ICD-10-CM

## 2022-10-10 MED ORDER — DOXAZOSIN MESYLATE 4 MG PO TABS: ORAL_TABLET | ORAL | 1 refills | Status: DC

## 2022-10-10 MED ORDER — ROSUVASTATIN CALCIUM 20 MG PO TABS: ORAL_TABLET | ORAL | 1 refills | Status: DC

## 2022-10-10 MED ORDER — NIFEDIPINE ER OSMOTIC RELEASE 90 MG PO TB24
ORAL_TABLET | ORAL | 1 refills | Status: DC
Start: 2022-10-10 — End: 2023-02-20

## 2022-10-10 MED ORDER — LOSARTAN POTASSIUM 100 MG PO TABS
ORAL_TABLET | ORAL | 1 refills | Status: DC
Start: 2022-10-10 — End: 2023-02-20

## 2022-10-10 MED ORDER — TADALAFIL 5 MG PO TABS: 5.0000 mg | ORAL_TABLET | Freq: Every day | ORAL | 11 refills | Status: DC

## 2022-10-10 MED ORDER — POTASSIUM CHLORIDE CRYS ER 20 MEQ PO TBCR: EXTENDED_RELEASE_TABLET | ORAL | 1 refills | Status: DC

## 2022-10-10 MED ORDER — INDAPAMIDE 1.25 MG PO TABS: ORAL_TABLET | ORAL | 1 refills | Status: DC

## 2022-10-10 NOTE — Telephone Encounter (Signed)
Done

## 2022-10-10 NOTE — Telephone Encounter (Signed)
Prescription Request  10/10/2022  LOV: Visit date not found  What is the name of the medication or equipment? doxazosin (CARDURA) 4 MG tablet indapamide (LOZOL) 1.25 MG tablet losartan (COZAAR) 100 MG tablet NIFEdipine (PROCARDIA XL/NIFEDICAL-XL) 90 MG 24 hr tablet potassium chloride SA (KLOR-CON M) 20 MEQ tablet rosuvastatin (CRESTOR) 20 MG tablet tadalafil (CIALIS) 5 MG tablet   Have you contacted your pharmacy to request a refill? Yes   Which pharmacy would you like this sent to?  [id number] - [place], [place] - [address] [place] [address] Phone: [phone] Fax: [phone]    Patient notified that their request is being sent to the clinical staff for review and that they should receive a response within 2 business days.   Please advise at Mobile [phone] (mobile)

## 2022-11-03 ENCOUNTER — Encounter: Payer: Self-pay | Admitting: Family Medicine

## 2022-11-14 ENCOUNTER — Ambulatory Visit: Payer: 59 | Admitting: Family Medicine

## 2022-11-14 VITALS — BP 126/85 | HR 53 | Wt 249.2 lb

## 2022-11-14 DIAGNOSIS — R7303 Prediabetes: Secondary | ICD-10-CM

## 2022-11-14 DIAGNOSIS — G6289 Other specified polyneuropathies: Secondary | ICD-10-CM

## 2022-11-14 DIAGNOSIS — C61 Malignant neoplasm of prostate: Secondary | ICD-10-CM | POA: Diagnosis not present

## 2022-11-14 NOTE — Progress Notes (Signed)
   Subjective:    Patient ID: [name] [name], male    DOB: [birth year], 67 y.o.   MRN: [id number]  HPI  Patient arrives today with tingling in left foot. Patient states when standing it is worse.  Tingling on the left foot from the ball of the foot through the toes.  Denies any other particular troubles that trigger this.  He does have underlying diabetes issues.  Tries to stay physically active.  His numbers in the past have been prediabetes   Review of Systems     Objective:   Physical Exam  General-in no acute distress Eyes-no discharge Lungs-respiratory rate normal, CTA CV-no murmurs,RRR Extremities skin warm dry no edema Neuro grossly normal Behavior normal, alert  Pulses in the feet monofilament test normal     Assessment & Plan:  1. Other polyneuropathy He has had previous B12 we will repeat B12 referral to podiatry if this worsens nerve conduction study but right now nerve conduction study unlikely to be fruitful I did talk with the patient that if he is interested in using a cream to help with the neuropathy we could call this into [facility] they would compound it - Ambulatory referral to Podiatry - Vitamin B12  2. Prediabetes Prediabetes healthy diet check A1c - Hemoglobin A1c - Basic Metabolic Panel (7)  3. Prostate cancer (HCC) History of prostate cancer check PSA - PSA

## 2022-11-27 ENCOUNTER — Ambulatory Visit: Payer: 59 | Admitting: Podiatry

## 2022-11-27 DIAGNOSIS — G609 Hereditary and idiopathic neuropathy, unspecified: Secondary | ICD-10-CM

## 2022-11-27 NOTE — Progress Notes (Signed)
  Subjective:  Patient ID: BOL MASIELLO, male    DOB: 1956/03/22,  MRN: [id number]  Chief Complaint  Patient presents with   Peripheral Neuropathy    Polyneuropathy- sometimes his feet are numb and tingles w/ walking and bending toes    67 y.o. male presents with the above complaint. History confirmed with patient.  He is not diabetic, had his B12 level checked and it was normal.  Objective:  Physical Exam: warm, good capillary refill, no trophic changes or ulcerative lesions, normal DP and PT pulses, and some scattered appreciation of monofilament.  Assessment:   1. Idiopathic neuropathy      Plan:  Patient was evaluated and treated and all questions answered.   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  Has no major known risk factors no spinal pathology no chemotherapy, he did serve in the [place] and may have been exposed to burn pits or chemicals.  We discussed treatment options including symptomatic treatment with topical medications or gabapentin or Lyrica.  He has not tried any of these yet, he will try OTC medications such as Biofreeze to see if this alleviates it.  I discussed with him if that not beneficial then a compound cream medicated from [facility] may be beneficial and he will discuss this with me or his PCP pending his progress.  We also discussed neurodiagnostic testing such as EMG and NCV, discussed with him that these may be able to offer diagnosis but often do not lead to changes in treatment decisions and so we will hold off on this at this point.  Return if symptoms worsen or fail to improve.

## 2022-11-30 ENCOUNTER — Encounter: Payer: Self-pay | Admitting: Family Medicine

## 2022-12-01 ENCOUNTER — Ambulatory Visit (INDEPENDENT_AMBULATORY_CARE_PROVIDER_SITE_OTHER): Payer: No Typology Code available for payment source | Admitting: Family Medicine

## 2022-12-01 ENCOUNTER — Telehealth: Payer: Self-pay | Admitting: Family Medicine

## 2022-12-01 VITALS — BP 117/74 | HR 66 | Temp 98.4°F | Ht 75.0 in | Wt 247.0 lb

## 2022-12-01 DIAGNOSIS — U071 COVID-19: Secondary | ICD-10-CM

## 2022-12-01 MED ORDER — AMOXICILLIN-POT CLAVULANATE 875-125 MG PO TABS: 1.0000 | ORAL_TABLET | Freq: Two times a day (BID) | ORAL | 0 refills | Status: DC

## 2022-12-01 NOTE — Telephone Encounter (Signed)
Patient advised per Dr Lorin Picket. Patient states he would like to be seen today. Patient scheduled and informed to come in at 4:15 pm today.

## 2022-12-01 NOTE — Progress Notes (Signed)
   Subjective:    Patient ID: [name] [name], male    DOB: 1955/05/31, 67 y.o.   MRN: [id number]  HPI Covid positive Cough- productive, chest congestion  symptoms and chest congestion  saturday afternoon fever only on Saturday Taking and cough syrup   Mild headache chest cough  Review of Systems     Objective:   Physical Exam  Gen-NAD not toxic TMS-normal bilateral T- normal no redness Chest-CTA respiratory rate normal no crackles CV RRR no murmur Skin-warm dry Neuro-grossly normal       Assessment & Plan:   Covid infection This is a viral process.  Mild cases are treated with supportive measures at home such as Tylenol rest fluids.  In some situations monoclonal antibodies may be appropriate depending on the patient's risk criteria.  The patient was educated regarding progressive illness including respiratory, persistent vomiting, change in mental status.  If any of these occur ER evaluation is recommended. Patient was educated about the following as well Covid-19 respiratory warning: Covid-19 is a virus that causes hypoxia (low oxygen level in blood) in some people. If you develop any changes in your usual breathing pattern: difficulty catching your breath, more short winded with activity or with resting, or anything that concerns you about your breathing, do not hesitate to go to the emergency department immediately for evaluation. Please do not delay to get treatment.   Agrees with plan of care discussed today. Understands warning signs to seek further care: Chest pain, shortness of breath, mental confusion, profuse vomiting, any significant change in health. Understands to follow-up if symptoms do not improve, or worsen.    Paxlovid renal dosing was not available-multiple pharmacies was tried-recommend Augmentin 875 twice daily for 7 days to cover for any secondary bacterial process Patient was encouraged to stay home from work through Wednesday then wear a mask for  an additional 5 days patient was also encouraged to give [place] feedback if she is getting worse and we can recheck him

## 2022-12-01 NOTE — Telephone Encounter (Signed)
Thank you-he is scheduled to come in today

## 2022-12-01 NOTE — Telephone Encounter (Signed)
Nurses If he is having any shortness of breath difficulty breathing or has a hard time breathing and his lungs I would like to see him at 4:15 PM if he is not having any breathing troubles no fever and is starting to feel better than generally it will run its course and get better on its own without any intervention he would need to follow his workplace guidelines regarding how long to stay out of work If he needs a work note to let us know

## 2022-12-01 NOTE — Telephone Encounter (Signed)
Patient wanted you to know tested positive for Covid on 11/22/22. He a fever, back pain  headache He took tylenol  and drunk plenty of fluids  He states t feeling better only has a really bad cough.

## 2022-12-05 ENCOUNTER — Telehealth: Payer: Self-pay | Admitting: Family Medicine

## 2022-12-05 ENCOUNTER — Other Ambulatory Visit: Payer: Self-pay | Admitting: Family Medicine

## 2022-12-05 MED ORDER — PROMETHAZINE-DM 6.25-15 MG/5ML PO SYRP: 5.0000 mL | ORAL_SOLUTION | Freq: Four times a day (QID) | ORAL | 0 refills | Status: DC | PRN

## 2022-12-05 NOTE — Telephone Encounter (Signed)
Tommie Sams, DO     Medicine sent.

## 2022-12-05 NOTE — Telephone Encounter (Signed)
Patient states feeling better but still has a bad cough . He is requesting something for the cough called into [facility] [address]

## 2023-01-01 ENCOUNTER — Ambulatory Visit: Payer: No Typology Code available for payment source | Admitting: Orthopedic Surgery

## 2023-01-01 ENCOUNTER — Encounter: Payer: Self-pay | Admitting: Orthopedic Surgery

## 2023-01-01 VITALS — BP 130/86 | HR 58 | Ht 75.0 in | Wt 254.0 lb

## 2023-01-01 DIAGNOSIS — M25561 Pain in right knee: Secondary | ICD-10-CM | POA: Diagnosis not present

## 2023-01-01 MED ORDER — METHYLPREDNISOLONE ACETATE 40 MG/ML IJ SUSP
40.0000 mg | Freq: Once | INTRAMUSCULAR | Status: AC
  Administered 2023-01-01: 40 mg via INTRA_ARTICULAR

## 2023-01-01 NOTE — Patient Instructions (Signed)
You have received an injection of steroids into the joint. 15% of patients will have increased pain within the 24 hours postinjection.   This is transient and will go away.   We recommend that you use ice packs on the injection site for 20 minutes every 2 hours and extra strength Tylenol 2 tablets every 8 as needed until the pain resolves.  If you continue to have pain after taking the Tylenol and using the ice please call the office for further instructions.  

## 2023-01-01 NOTE — Progress Notes (Signed)
Chief Complaint  Patient presents with   Knee Pain    Right knee pain x 2 weeks leg pressing approximately 400 lbs   Acute injury right knee leg pressing  Seems to be getting better with ice rest Tylenol and knee sleeve  Has some residual global pain more medial than lateral  Trace joint effusion of the right knee patient walking without support no limp tenderness over the medial portion of the joint With no loss of motion  Probable knee strain  Injection given to help speed up the healing process  Procedure note right knee injection   verbal consent was obtained to inject right knee joint  Timeout was completed to confirm the site of injection  The medications used were depomedrol 40 mg and 1% lidocaine 3 cc Anesthesia was provided by ethyl chloride and the skin was prepped with alcohol.  After cleaning the skin with alcohol a 20-gauge needle was used to inject the right knee joint. There were no complications. A sterile bandage was applied.

## 2023-01-29 LAB — LAB REPORT - SCANNED
A1c: 6.2
EGFR: 50

## 2023-02-05 ENCOUNTER — Encounter: Payer: Self-pay | Admitting: Family Medicine

## 2023-02-19 ENCOUNTER — Other Ambulatory Visit: Payer: Self-pay | Admitting: Family Medicine

## 2023-02-20 ENCOUNTER — Encounter: Payer: Self-pay | Admitting: Family Medicine

## 2023-02-20 ENCOUNTER — Other Ambulatory Visit: Payer: Self-pay | Admitting: Family Medicine

## 2023-02-20 ENCOUNTER — Telehealth: Payer: Self-pay | Admitting: Family Medicine

## 2023-02-20 ENCOUNTER — Ambulatory Visit: Payer: 59 | Admitting: Family Medicine

## 2023-02-20 VITALS — BP 122/70 | HR 76 | Ht 75.0 in | Wt 255.4 lb

## 2023-02-20 DIAGNOSIS — E782 Mixed hyperlipidemia: Secondary | ICD-10-CM

## 2023-02-20 DIAGNOSIS — I10 Essential (primary) hypertension: Secondary | ICD-10-CM

## 2023-02-20 DIAGNOSIS — Z8639 Personal history of other endocrine, nutritional and metabolic disease: Secondary | ICD-10-CM

## 2023-02-20 MED ORDER — LOSARTAN POTASSIUM 100 MG PO TABS: ORAL_TABLET | ORAL | 1 refills | Status: DC

## 2023-02-20 MED ORDER — NIFEDIPINE ER OSMOTIC RELEASE 90 MG PO TB24: ORAL_TABLET | ORAL | 1 refills | Status: DC

## 2023-02-20 MED ORDER — ROSUVASTATIN CALCIUM 20 MG PO TABS: ORAL_TABLET | ORAL | 1 refills | Status: DC

## 2023-02-20 MED ORDER — CARVEDILOL 12.5 MG PO TABS: ORAL_TABLET | ORAL | 1 refills | Status: DC

## 2023-02-20 MED ORDER — DOXAZOSIN MESYLATE 4 MG PO TABS: ORAL_TABLET | ORAL | 1 refills | Status: DC

## 2023-02-20 MED ORDER — INDAPAMIDE 1.25 MG PO TABS: ORAL_TABLET | ORAL | 1 refills | Status: DC

## 2023-02-20 MED ORDER — POTASSIUM CHLORIDE CRYS ER 20 MEQ PO TBCR: EXTENDED_RELEASE_TABLET | ORAL | 1 refills | Status: DC

## 2023-02-20 NOTE — Telephone Encounter (Signed)
Patient called by to let you know the medication    carvedilol (COREG) 12.5 MG tablet  Is one tablet(25 mg) by mouth twice daily

## 2023-02-22 LAB — MICROALBUMIN / CREATININE URINE RATIO
Creatinine, Urine: 276.3 mg/dL
Microalb/Creat Ratio: 3 mg/g{creat} (ref 0–29)
Microalbumin, Urine: 7.2 ug/mL

## 2023-02-24 NOTE — Telephone Encounter (Signed)
He was utilizing a 25 mg taken half tablet twice daily We are switching him over to 12.5 twice daily MyChart message sent

## 2023-02-27 NOTE — Progress Notes (Signed)
   Subjective:    Patient ID: [name] [name], male    DOB: [birth year], 67 y.o.   MRN: [id number]  HPI Medications reviewed Patient for blood pressure check up.  The patient does have hypertension.   Patient relates dietary measures try to minimize salt The importance of healthy diet and activity were discussed Patient relates compliance  Patient here for follow-up regarding cholesterol.    Patient relates taking medication on a regular basis Denies problems with medication Importance of dietary measures discussed Regular lab work regarding lipid and liver was checked and if needing additional labs was appropriately ordered   Review of Systems     Objective:   Physical Exam  General-in no acute distress Eyes-no discharge Lungs-respiratory rate normal, CTA CV-no murmurs,RRR Extremities skin warm dry no edema Neuro grossly normal Behavior normal, alert       Assessment & Plan:   1. Mixed hyperlipidemia Continue healthy diet - rosuvastatin (CRESTOR) 20 MG tablet; TAKE 1 TABLET(20 MG) BY MOUTH DAILY  Dispense: 90 tablet; Refill: 1  2. Hx of hypokalemia Continue potassium - potassium chloride SA (KLOR-CON M) 20 MEQ tablet; TAKE 3 TABS BY MOUTH EVERY MORNING AND 3 TABS BY MOUTH EVERY EVENING  Dispense: 540 tablet; Refill: 1  3. Essential hypertension, benign Continue blood pressure medicine - Microalbumin/Creatinine Ratio, Urine Patient was uncertain regarding the Coreg but was able to send [place] the medication that he is using This has been corrected in the chart  Follow-up within 6 months

## 2023-02-27 NOTE — Addendum Note (Signed)
Addended by: Lilyan Punt A on: 02/27/2023 10:48 AM   Modules accepted: Orders

## 2023-04-09 ENCOUNTER — Encounter: Payer: Self-pay | Admitting: Family Medicine

## 2023-04-09 NOTE — Telephone Encounter (Signed)
 Care team updated and letter sent for eye exam notes.

## 2023-06-15 ENCOUNTER — Telehealth: Payer: Self-pay | Admitting: Family Medicine

## 2023-06-15 DIAGNOSIS — Z8639 Personal history of other endocrine, nutritional and metabolic disease: Secondary | ICD-10-CM

## 2023-06-15 MED ORDER — CARVEDILOL 12.5 MG PO TABS: ORAL_TABLET | ORAL | 1 refills | Status: DC

## 2023-06-15 MED ORDER — POTASSIUM CHLORIDE CRYS ER 20 MEQ PO TBCR: EXTENDED_RELEASE_TABLET | ORAL | 1 refills | Status: DC

## 2023-06-15 NOTE — Telephone Encounter (Signed)
 Refill on carvedilol (COREG) 12.5 MG tablet , potassium chloride SA (KLOR-CON M) 20 MEQ tablet  send to [facility] [address]

## 2023-08-20 ENCOUNTER — Encounter: Payer: Self-pay | Admitting: Family Medicine

## 2023-08-20 ENCOUNTER — Ambulatory Visit: Payer: 59 | Admitting: Family Medicine

## 2023-08-20 VITALS — BP 128/76 | HR 56 | Temp 98.1°F | Ht 75.0 in | Wt 252.0 lb

## 2023-08-20 DIAGNOSIS — N1831 Chronic kidney disease, stage 3a: Secondary | ICD-10-CM | POA: Diagnosis not present

## 2023-08-20 DIAGNOSIS — I10 Essential (primary) hypertension: Secondary | ICD-10-CM | POA: Diagnosis not present

## 2023-08-20 DIAGNOSIS — E782 Mixed hyperlipidemia: Secondary | ICD-10-CM | POA: Diagnosis not present

## 2023-08-20 DIAGNOSIS — Z8639 Personal history of other endocrine, nutritional and metabolic disease: Secondary | ICD-10-CM

## 2023-08-20 MED ORDER — LOSARTAN POTASSIUM 100 MG PO TABS: ORAL_TABLET | ORAL | 1 refills | Status: DC

## 2023-08-20 MED ORDER — CARVEDILOL 12.5 MG PO TABS: ORAL_TABLET | ORAL | 1 refills | Status: DC

## 2023-08-20 MED ORDER — FAMOTIDINE 40 MG PO TABS: 40.0000 mg | ORAL_TABLET | Freq: Every day | ORAL | 1 refills | Status: DC

## 2023-08-20 MED ORDER — ROSUVASTATIN CALCIUM 20 MG PO TABS: ORAL_TABLET | ORAL | 1 refills | Status: DC

## 2023-08-20 MED ORDER — DOXAZOSIN MESYLATE 4 MG PO TABS
ORAL_TABLET | ORAL | 1 refills | Status: DC
Start: 2023-08-20 — End: 2024-02-22

## 2023-08-20 MED ORDER — NIFEDIPINE ER OSMOTIC RELEASE 90 MG PO TB24: ORAL_TABLET | ORAL | 1 refills | Status: DC

## 2023-08-20 MED ORDER — INDAPAMIDE 1.25 MG PO TABS: ORAL_TABLET | ORAL | 1 refills | Status: DC

## 2023-08-20 MED ORDER — POTASSIUM CHLORIDE CRYS ER 20 MEQ PO TBCR: EXTENDED_RELEASE_TABLET | ORAL | 1 refills | Status: DC

## 2023-08-20 NOTE — Progress Notes (Signed)
   Subjective:    Patient ID: [name] [name], male    DOB: 05/23/1955, 68 y.o.   MRN: [id number]  HPI  Patient is here for 6 month follow up and currently has no concerns  Patient overall doing quite well He will [facility] in the summer or early fall and have lab work with them Taking his medications as directed Not having any setbacks Denies any chest tightness pressure pain shortness of breath denies any swelling in the legs. Review of Systems     Objective:   Physical Exam General-in no acute distress Eyes-no discharge Lungs-respiratory rate normal, CTA CV-no murmurs,RRR Extremities skin warm dry no edema Neuro grossly normal Behavior normal, alert        Assessment & Plan:   Patient's BMI is elevated but he is a very muscular person He is trying to eat healthy Taking his medicines on a regular basis He will check with [facility] doctor if they are can do blood work in the near future if so that would be fine otherwise he will connect with us  so we will order blood work Blood pressure looks very good today Follow-up in 6 months Recommend yearly PSA to make sure that it stays undetectable Kidney function previously stable but will need follow-up labs within 6 months if not through the [place] through us

## 2023-08-21 ENCOUNTER — Other Ambulatory Visit: Payer: Self-pay

## 2023-08-21 MED ORDER — FAMOTIDINE 40 MG PO TABS: 40.0000 mg | ORAL_TABLET | Freq: Every day | ORAL | 1 refills | Status: DC

## 2023-08-24 ENCOUNTER — Encounter: Payer: Self-pay | Admitting: Family Medicine

## 2023-10-08 ENCOUNTER — Ambulatory Visit: Payer: PRIVATE HEALTH INSURANCE | Admitting: Orthopedic Surgery

## 2023-10-09 ENCOUNTER — Ambulatory Visit: Payer: PRIVATE HEALTH INSURANCE | Admitting: Orthopedic Surgery

## 2023-10-12 ENCOUNTER — Other Ambulatory Visit: Payer: Self-pay

## 2023-10-12 MED ORDER — NIFEDIPINE ER OSMOTIC RELEASE 90 MG PO TB24: ORAL_TABLET | ORAL | 1 refills | Status: DC

## 2023-11-03 ENCOUNTER — Encounter: Payer: Self-pay | Admitting: Family Medicine

## 2023-12-02 ENCOUNTER — Other Ambulatory Visit: Payer: Self-pay | Admitting: Family Medicine

## 2023-12-02 DIAGNOSIS — Z8639 Personal history of other endocrine, nutritional and metabolic disease: Secondary | ICD-10-CM

## 2023-12-23 LAB — LAB REPORT - SCANNED: TSH: 0.846

## 2023-12-25 ENCOUNTER — Encounter: Payer: Self-pay | Admitting: Family Medicine

## 2024-01-11 ENCOUNTER — Encounter: Payer: Self-pay | Admitting: Family Medicine

## 2024-01-11 NOTE — Telephone Encounter (Signed)
 Nurses I reviewed over the lab work it looks stable Please print off a copy of this lab work so that I can sign off on it and scanned into the system thank you  [name] [name] is to keep all regular follow-up visits thank you

## 2024-01-12 ENCOUNTER — Telehealth: Payer: Self-pay | Admitting: Family Medicine

## 2024-01-12 NOTE — Telephone Encounter (Signed)
 Patient's lab work that had completed at the [facility] This was scanned into the system Urinalysis looked good Sodium 145, glucose 94, creatinine 1.54, potassium 3.6, ALT G OT normal, alkaline phos 138, triglycerides 68, cholesterol 172, LDL 103, TSH 0.846, B12 789

## 2024-01-14 ENCOUNTER — Other Ambulatory Visit: Payer: Self-pay | Admitting: Nurse Practitioner

## 2024-01-14 ENCOUNTER — Ambulatory Visit
Admission: RE | Admit: 2024-01-14 | Discharge: 2024-01-14 | Disposition: A | Discharge status: Home / Self Care | Payer: Worker's Compensation | Source: Ambulatory Visit | Attending: Nurse Practitioner

## 2024-01-14 DIAGNOSIS — R52 Pain, unspecified: Secondary | ICD-10-CM

## 2024-01-14 DIAGNOSIS — W19XXXA Unspecified fall, initial encounter: Secondary | ICD-10-CM

## 2024-02-02 ENCOUNTER — Encounter: Payer: Self-pay | Admitting: Family Medicine

## 2024-02-20 ENCOUNTER — Other Ambulatory Visit: Payer: Self-pay | Admitting: Family Medicine

## 2024-02-20 DIAGNOSIS — E782 Mixed hyperlipidemia: Secondary | ICD-10-CM

## 2024-02-21 ENCOUNTER — Other Ambulatory Visit: Payer: Self-pay | Admitting: Family Medicine

## 2024-02-22 ENCOUNTER — Encounter: Payer: Self-pay | Admitting: Family Medicine

## 2024-02-22 ENCOUNTER — Ambulatory Visit: Admitting: Family Medicine

## 2024-02-22 DIAGNOSIS — Z8639 Personal history of other endocrine, nutritional and metabolic disease: Secondary | ICD-10-CM | POA: Diagnosis not present

## 2024-02-22 DIAGNOSIS — E782 Mixed hyperlipidemia: Secondary | ICD-10-CM

## 2024-02-22 MED ORDER — CARVEDILOL 12.5 MG PO TABS: ORAL_TABLET | ORAL | 1 refills | Status: AC

## 2024-02-22 MED ORDER — LOSARTAN POTASSIUM 100 MG PO TABS: ORAL_TABLET | ORAL | 1 refills | Status: DC

## 2024-02-22 MED ORDER — POTASSIUM CHLORIDE CRYS ER 20 MEQ PO TBCR: EXTENDED_RELEASE_TABLET | ORAL | 1 refills | Status: AC

## 2024-02-22 MED ORDER — NIFEDIPINE ER OSMOTIC RELEASE 90 MG PO TB24: ORAL_TABLET | ORAL | 1 refills | Status: DC

## 2024-02-22 MED ORDER — INDAPAMIDE 1.25 MG PO TABS: ORAL_TABLET | ORAL | 1 refills | Status: DC

## 2024-02-22 MED ORDER — ROSUVASTATIN CALCIUM 40 MG PO TABS: ORAL_TABLET | ORAL | 1 refills | Status: AC

## 2024-02-22 MED ORDER — DOXAZOSIN MESYLATE 4 MG PO TABS: ORAL_TABLET | ORAL | 1 refills | Status: DC

## 2024-02-22 NOTE — Progress Notes (Signed)
   Subjective:    Patient ID: [name] [name], male    DOB: 04-Dec-1955, 68 y.o.   MRN: [id number]  HPI  Follow up  Waiting o med approval  Discussed the use of AI scribe software for clinical note transcription with the patient, who gave verbal consent to proceed.  History of Present Illness   [name] [name] is a 68 year old male who presents for a routine follow-up visit.  He generally feels well and maintains a balanced diet, including vegetables, fruits, and meats. He exercises twice a week when possible. No blood in bowel movements, breathing issues during exercise, or leg swelling. Bowel movements are regular every morning, and eyesight is stable.  He mentions his shoulders feel 'a little rough' but he tries to keep them moving. He recently received his flu shot three weeks ago and believes he is up to date with his pneumonia vaccination.  He is currently taking rosuvastatin  for cholesterol management, with a recent LDL level of 103 mg/dL, and tolerates the medication well. He also takes potassium three times a day, in the morning and at night.  He has a history of prostate surgery and experiences some urinary leakage, for which he takes Detrol to help with bladder spasms. The leakage remains a problem.  He takes a low-dose over-the-counter allergy medicine and avoids decongestants to prevent an increase in blood pressure. He visits his daughter who is in the [employer].      Review of Systems     Objective:   Physical Exam  General-in no acute distress Eyes-no discharge Lungs-respiratory rate normal, CTA CV-no murmurs,RRR Extremities skin warm dry no edema Neuro grossly normal Behavior normal, alert Abdomen soft no masses or tenderness      Assessment & Plan:   Assessment and Plan    Hyperlipidemia LDL cholesterol elevated at 103 mg/dL, target is <29 mg/dL. Previous LDL was 88 mg/dL. Rosuvastatin  20 mg well-tolerated. Dose increase considered to reduce  cardiovascular risk. - Increased rosuvastatin  to 40 mg daily to lower LDL and reduce cardiovascular risk.  Chronic kidney disease Kidney function stable. L-arginine safe with stable kidney function. - Continue monitoring kidney function with regular lab tests.  Urinary incontinence following prostate surgery Urinary incontinence post-surgery. Detrol used for bladder spasms, not urethral leakage. Surgical options considered if symptoms persist. - Continue Detrol for bladder spasms. - Consider surgical options if incontinence persists.  General Health Maintenance Up to date on vaccinations. Regular exercise and balanced diet maintained. - Continue regular exercise and balanced diet. - Ensure [facility] with [facility] for [facility].      1. Mixed hyperlipidemia Continue current medication.  Keep LDL below 70 if possible - rosuvastatin  (CRESTOR ) 40 MG tablet; TAKE 1 TABLET BY MOUTH DAILY 40mg   Dispense: 90 tablet; Refill: 1  2. Hx of hypokalemia Continue current medication previous lab work look good - potassium chloride  SA (KLOR-CON  M) 20 MEQ tablet; TAKE 3 TABLETS BY MOUTH TWICE DAILY IN THE MORNING AND EVERY EVENING  Dispense: 540 tablet; Refill: 1 Follow-up 6 months Wellness in 6 months

## 2024-02-23 ENCOUNTER — Encounter: Payer: Self-pay | Admitting: Family Medicine

## 2024-04-11 ENCOUNTER — Telehealth: Payer: Self-pay | Admitting: Family Medicine

## 2024-04-11 ENCOUNTER — Telehealth: Payer: Self-pay

## 2024-04-11 NOTE — Telephone Encounter (Signed)
 Prescription Request  04/11/2024  LOV: Visit date not found  What is the name of the medication or equipment? NIFEdipine  (PROCARDIA  XL/NIFEDICAL-XL) 90 MG 24 hr tablet   Have you contacted your pharmacy to request a refill? Yes   Which pharmacy would you like this sent to?  [id number] - [place], [place] - [address] [place] [address] Phone: [phone] Fax: [phone]    Patient notified that their request is being sent to the clinical staff for review and that they should receive a response within 2 business days.   Please advise at Mobile [phone] (mobile)

## 2024-04-11 NOTE — Telephone Encounter (Unsigned)
 Copied from CRM [id number]. Topic: Clinical - Medication Refill >> Apr 11, 2024  1:31 PM China J wrote: Medication:  doxazosin  (CARDURA ) 4 MG tablet indapamide  (LOZOL ) 1.25 MG tablet losartan  (COZAAR ) 100 MG tablet  Has the patient contacted their pharmacy? Yes (Agent: If no, request that the patient contact the pharmacy for the refill. If patient does not wish to contact the pharmacy document the reason why and proceed with request.) (Agent: If yes, when and what did the pharmacy advise?) No refills available.   This is the patient's preferred pharmacy:  [id number] - [place], [place] - [address] [place] [address] Phone: [phone] Fax: [phone]  Is this the correct pharmacy for this prescription? Yes If no, delete pharmacy and type the correct one.   Has the prescription been filled recently? No  Is the patient out of the medication? Yes  Has the patient been seen for an appointment in the last year OR does the patient have an upcoming appointment? Yes  Can we respond through MyChart? Yes  Agent: Please be advised that Rx refills may take up to 3 business days. We ask that you follow-up with your pharmacy.

## 2024-04-12 MED ORDER — DOXAZOSIN MESYLATE 4 MG PO TABS: ORAL_TABLET | ORAL | 1 refills | Status: AC

## 2024-04-12 MED ORDER — INDAPAMIDE 1.25 MG PO TABS: ORAL_TABLET | ORAL | 1 refills | Status: AC

## 2024-04-12 MED ORDER — LOSARTAN POTASSIUM 100 MG PO TABS: ORAL_TABLET | ORAL | 1 refills | Status: AC

## 2024-04-18 ENCOUNTER — Other Ambulatory Visit: Payer: Self-pay | Admitting: Family Medicine

## 2024-04-18 MED ORDER — NIFEDIPINE ER OSMOTIC RELEASE 90 MG PO TB24: ORAL_TABLET | ORAL | 1 refills | Status: AC

## 2024-04-18 NOTE — Telephone Encounter (Signed)
 Copied from CRM [id number]. Topic: Clinical - Medication Refill >> Apr 18, 2024 12:38 PM Suzen RAMAN wrote: Medication: NIFEdipine  (PROCARDIA  XL/NIFEDICAL-XL) 90 MG 24 hr tablet   Has the patient contacted their pharmacy? Yes   This is the patient's preferred pharmacy:  [id number] - [place], [place] - [address] [place] [address] Phone: [phone] Fax: [phone]  Is this the correct pharmacy for this prescription? Yes If no, delete pharmacy and type the correct one.   Has the prescription been filled recently? No  Is the patient out of the medication? Yes, since 04/07/24  Has the patient been seen for an appointment in the last year OR does the patient have an upcoming appointment? Yes  Can we respond through MyChart? Yes  Agent: Please be advised that Rx refills may take up to 3 business days. We ask that you follow-up with your pharmacy.

## 2024-08-31 ENCOUNTER — Encounter: Admitting: Family Medicine
